# Patient Record
Sex: Female | Born: 1955 | Race: White | Hispanic: No | Marital: Married | Smoking: Never smoker
Health system: Southern US, Community
[De-identification: ages and names within clinical notes are randomized; demographics above are authoritative.]

## PROBLEM LIST (undated history)

## (undated) DIAGNOSIS — E739 Lactose intolerance, unspecified: Secondary | ICD-10-CM

## (undated) DIAGNOSIS — K6389 Other specified diseases of intestine: Secondary | ICD-10-CM

## (undated) DIAGNOSIS — N3010 Interstitial cystitis (chronic) without hematuria: Secondary | ICD-10-CM

## (undated) DIAGNOSIS — K219 Gastro-esophageal reflux disease without esophagitis: Secondary | ICD-10-CM

## (undated) DIAGNOSIS — E0500 Thyrotoxicosis with diffuse goiter without thyrotoxic crisis or storm: Secondary | ICD-10-CM

## (undated) DIAGNOSIS — Z8719 Personal history of other diseases of the digestive system: Secondary | ICD-10-CM

## (undated) DIAGNOSIS — K581 Irritable bowel syndrome with constipation: Secondary | ICD-10-CM

## (undated) DIAGNOSIS — J309 Allergic rhinitis, unspecified: Secondary | ICD-10-CM

## (undated) DIAGNOSIS — M1990 Unspecified osteoarthritis, unspecified site: Secondary | ICD-10-CM

## (undated) DIAGNOSIS — F419 Anxiety disorder, unspecified: Secondary | ICD-10-CM

## (undated) DIAGNOSIS — C719 Malignant neoplasm of brain, unspecified: Secondary | ICD-10-CM

## (undated) DIAGNOSIS — G4700 Insomnia, unspecified: Secondary | ICD-10-CM

## (undated) DIAGNOSIS — K638219 Small intestinal bacterial overgrowth, unspecified: Secondary | ICD-10-CM

## (undated) DIAGNOSIS — E039 Hypothyroidism, unspecified: Secondary | ICD-10-CM

## (undated) HISTORY — DX: Insomnia, unspecified: G47.00

## (undated) HISTORY — DX: Anxiety disorder, unspecified: F41.9

## (undated) HISTORY — DX: Other specified diseases of intestine: K63.89

## (undated) HISTORY — PX: BRAIN SURGERY: SHX531

## (undated) HISTORY — DX: Unspecified osteoarthritis, unspecified site: M19.90

## (undated) HISTORY — DX: Irritable bowel syndrome with constipation: K58.1

## (undated) HISTORY — DX: Small intestinal bacterial overgrowth, unspecified: K63.8219

---

## 1999-07-13 ENCOUNTER — Other Ambulatory Visit: Admission: RE | Admit: 1999-07-13 | Discharge: 1999-07-13 | Payer: Self-pay | Admitting: Obstetrics and Gynecology

## 1999-12-31 ENCOUNTER — Other Ambulatory Visit: Admission: RE | Admit: 1999-12-31 | Discharge: 1999-12-31 | Payer: Self-pay | Admitting: Obstetrics and Gynecology

## 2000-08-09 ENCOUNTER — Other Ambulatory Visit: Admission: RE | Admit: 2000-08-09 | Discharge: 2000-08-09 | Payer: Self-pay | Admitting: Obstetrics and Gynecology

## 2000-09-08 ENCOUNTER — Ambulatory Visit (HOSPITAL_COMMUNITY): Admission: RE | Admit: 2000-09-08 | Discharge: 2000-09-08 | Payer: Self-pay | Admitting: Gastroenterology

## 2001-01-06 ENCOUNTER — Other Ambulatory Visit: Admission: RE | Admit: 2001-01-06 | Discharge: 2001-01-06 | Payer: Self-pay | Admitting: Obstetrics and Gynecology

## 2002-01-25 ENCOUNTER — Other Ambulatory Visit: Admission: RE | Admit: 2002-01-25 | Discharge: 2002-01-25 | Payer: Self-pay | Admitting: Obstetrics and Gynecology

## 2003-01-28 ENCOUNTER — Other Ambulatory Visit: Admission: RE | Admit: 2003-01-28 | Discharge: 2003-01-28 | Payer: Self-pay | Admitting: Obstetrics and Gynecology

## 2003-03-18 ENCOUNTER — Encounter: Payer: Self-pay | Admitting: Gastroenterology

## 2003-03-18 ENCOUNTER — Encounter: Admission: RE | Admit: 2003-03-18 | Discharge: 2003-03-18 | Payer: Self-pay | Admitting: Gastroenterology

## 2004-02-21 ENCOUNTER — Other Ambulatory Visit: Admission: RE | Admit: 2004-02-21 | Discharge: 2004-02-21 | Payer: Self-pay | Admitting: Obstetrics and Gynecology

## 2004-11-14 ENCOUNTER — Inpatient Hospital Stay (HOSPITAL_COMMUNITY): Admission: AD | Admit: 2004-11-14 | Discharge: 2004-11-16 | Payer: Self-pay | Admitting: Neurosurgery

## 2004-11-14 ENCOUNTER — Emergency Department (HOSPITAL_COMMUNITY): Admission: EM | Admit: 2004-11-14 | Discharge: 2004-11-14 | Payer: Self-pay | Admitting: Emergency Medicine

## 2004-11-17 ENCOUNTER — Inpatient Hospital Stay (HOSPITAL_COMMUNITY): Admission: AD | Admit: 2004-11-17 | Discharge: 2004-11-21 | Payer: Self-pay | Admitting: Neurosurgery

## 2004-11-18 ENCOUNTER — Encounter: Payer: Self-pay | Admitting: Neurosurgery

## 2004-11-18 ENCOUNTER — Encounter (INDEPENDENT_AMBULATORY_CARE_PROVIDER_SITE_OTHER): Payer: Self-pay | Admitting: Specialist

## 2004-12-02 ENCOUNTER — Encounter: Admission: RE | Admit: 2004-12-02 | Discharge: 2004-12-02 | Payer: Self-pay | Admitting: Neurosurgery

## 2004-12-02 ENCOUNTER — Ambulatory Visit (HOSPITAL_COMMUNITY): Admission: RE | Admit: 2004-12-02 | Discharge: 2004-12-02 | Payer: Self-pay | Admitting: *Deleted

## 2004-12-15 ENCOUNTER — Encounter: Admission: RE | Admit: 2004-12-15 | Discharge: 2004-12-15 | Payer: Self-pay | Admitting: Neurosurgery

## 2004-12-30 ENCOUNTER — Ambulatory Visit: Payer: Self-pay | Admitting: Oncology

## 2005-01-20 ENCOUNTER — Ambulatory Visit: Admission: RE | Admit: 2005-01-20 | Discharge: 2005-02-26 | Payer: Self-pay | Admitting: *Deleted

## 2005-01-26 ENCOUNTER — Encounter: Admission: RE | Admit: 2005-01-26 | Discharge: 2005-01-26 | Payer: Self-pay | Admitting: Neurosurgery

## 2005-04-12 ENCOUNTER — Other Ambulatory Visit: Admission: RE | Admit: 2005-04-12 | Discharge: 2005-04-12 | Payer: Self-pay | Admitting: Obstetrics and Gynecology

## 2006-01-09 IMAGING — CT CT HEAD W/O CM
1 series · 16 of 30 positions shown, 20 images · non-contrast
Comparison: none

CLINICAL DATA: 48 year-old follow-up craniotomy.
NONCONTRAST CRANIAL CT:
Standard head CT was performed without contrast and compared to previous study of 12/02/04.  
Intracranially, stable surgical changes in the right temporal area with encephalomalacia.  Resolving pneumocephalus.  No extraaxial fluid collections are seen. Ventricles are normal in size and in the midline.  The blood products have resolved and there is more of a cystic appearing area in the upper aspect of this lesion. No new lesions are seen. No complicating features such as infarction. The brainstem and cerebellum appear normal.

[Series 2: brain · axial · 0.49mm/px · z∈[+40,+179]mm · 16 of 30 slices shown, 20 images]
[im 2/30  brain]
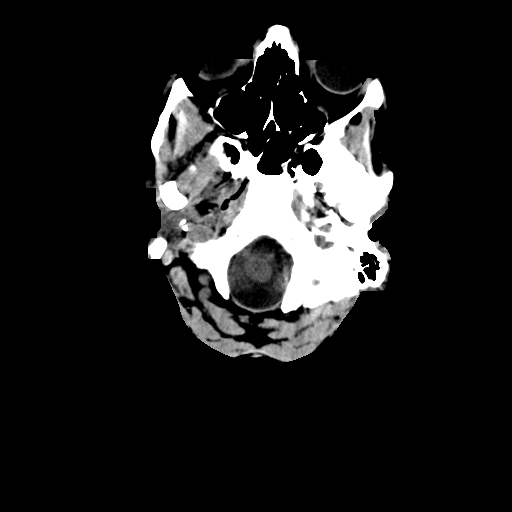
[im 2/30  bone]
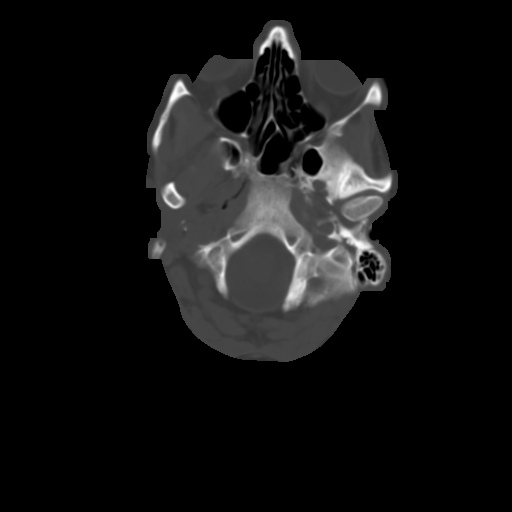
[im 4/30  brain]
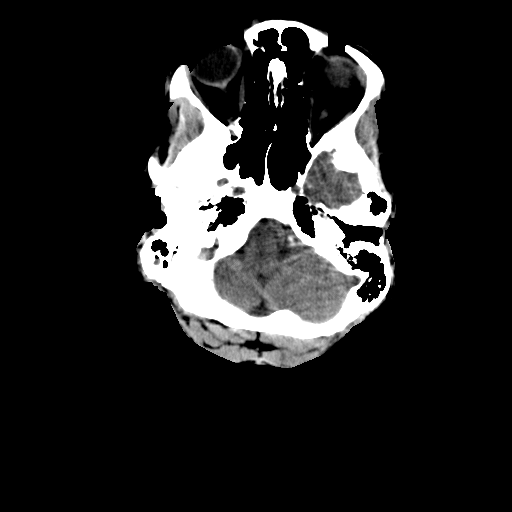
[im 6/30  brain]
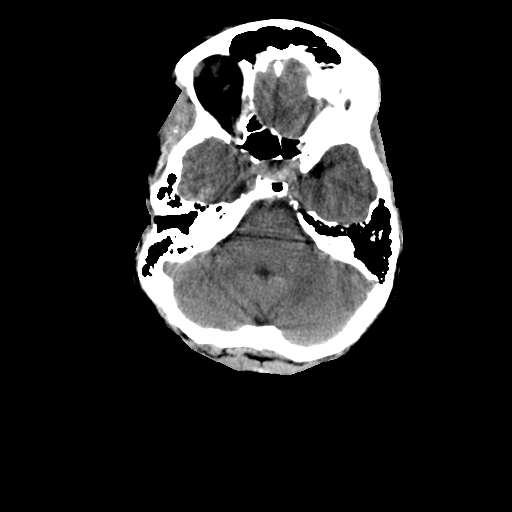
[im 8/30  brain]
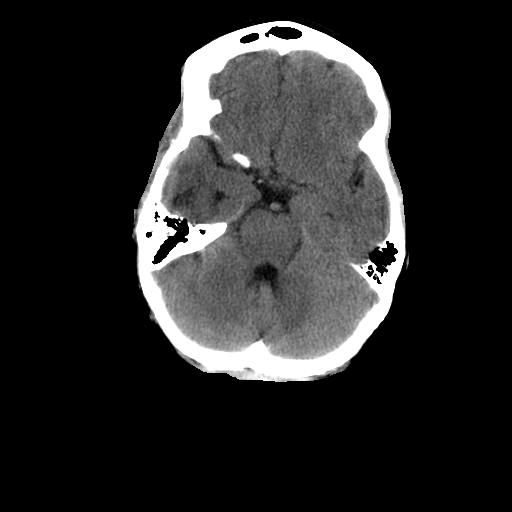
[im 9/30  brain]
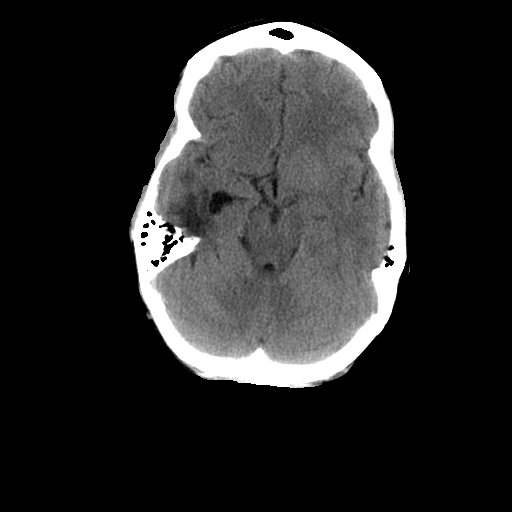
[im 9/30  bone]
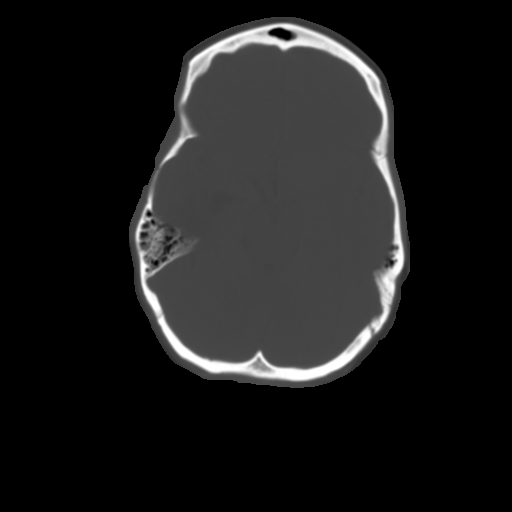
[im 11/30  brain]
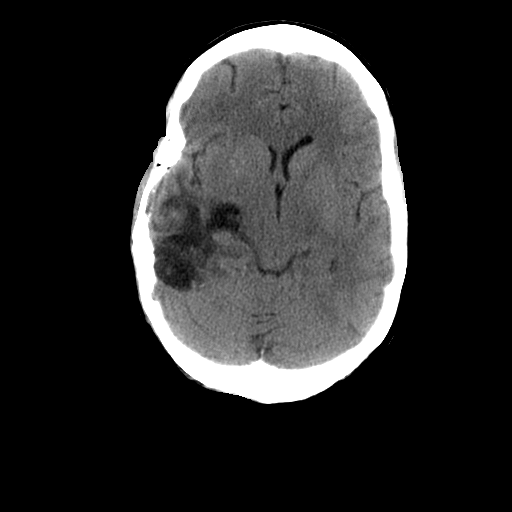
[im 13/30  brain]
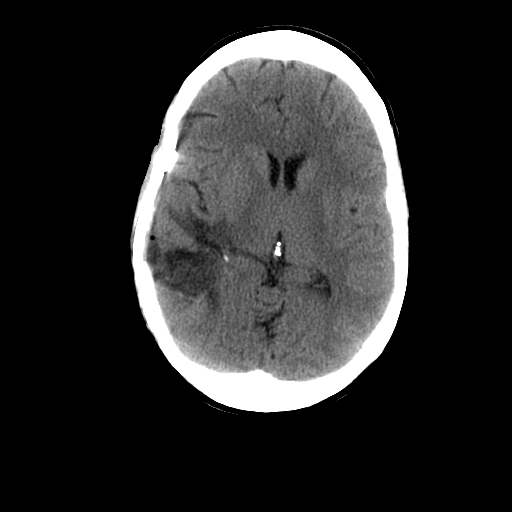
[im 15/30  brain]
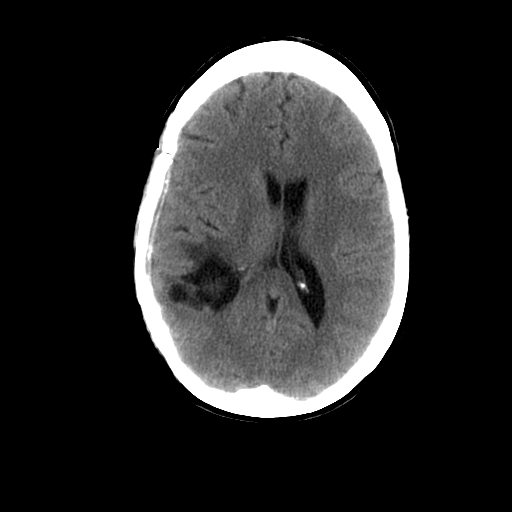
[im 16/30  brain]
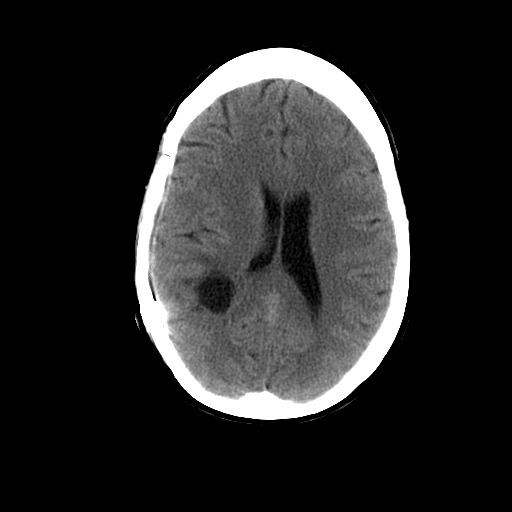
[im 16/30  bone]
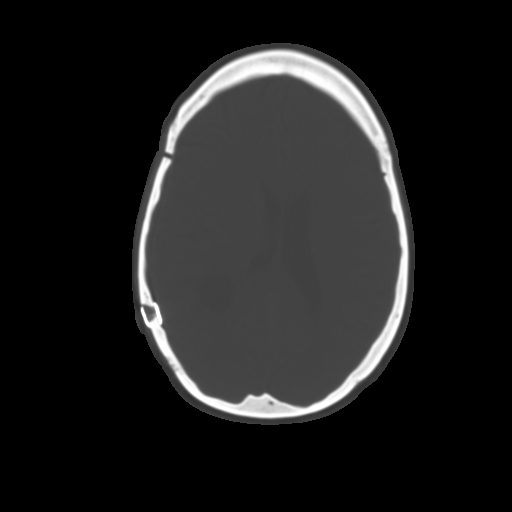
[im 18/30  brain]
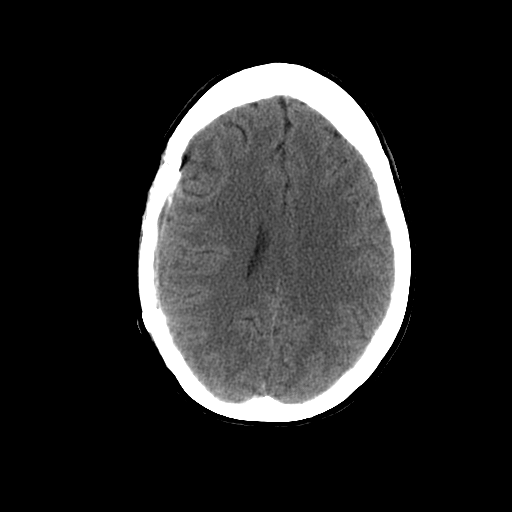
[im 20/30  brain]
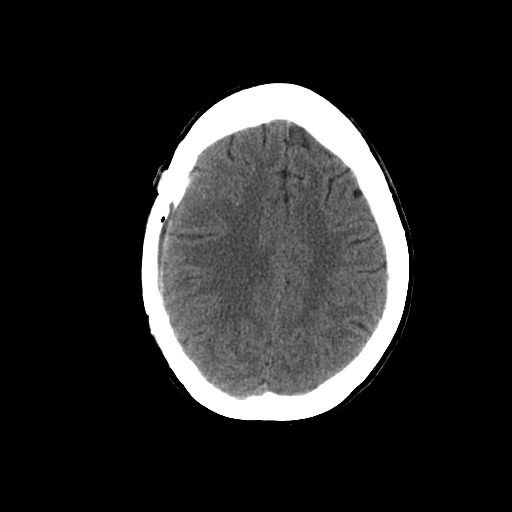
[im 22/30  brain]
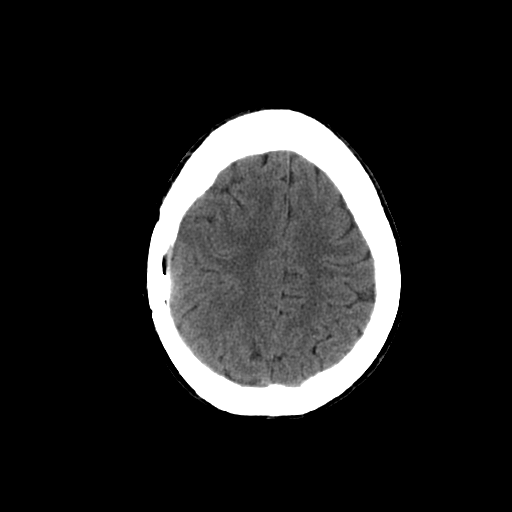
[im 23/30  brain]
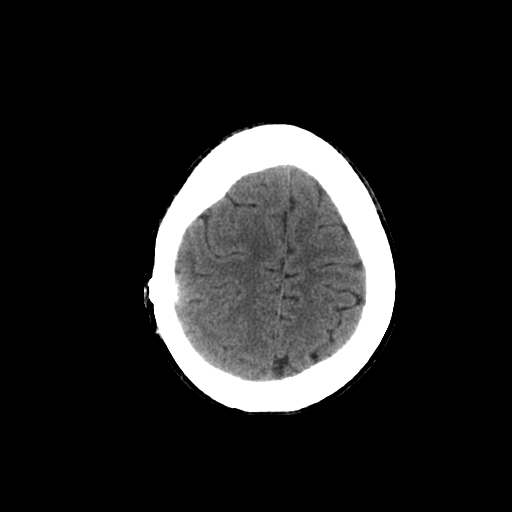
[im 23/30  bone]
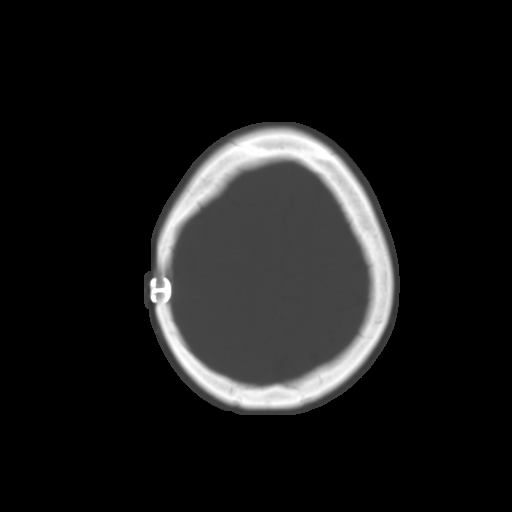
[im 25/30  brain]
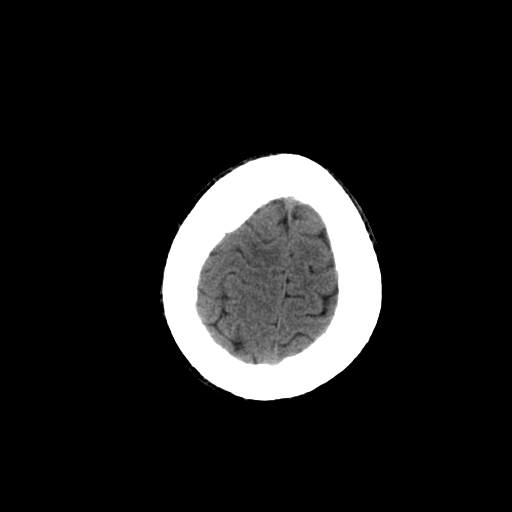
[im 27/30  brain]
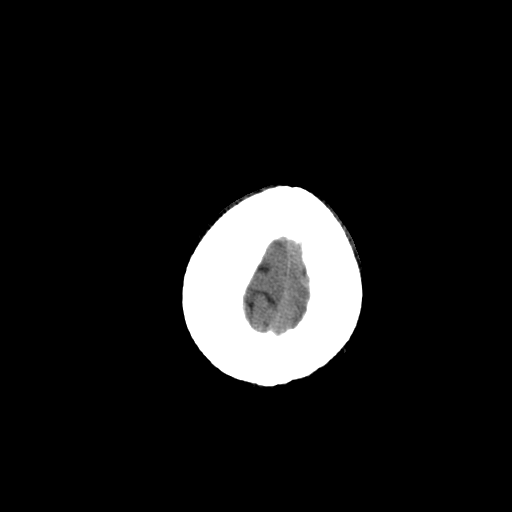
[im 29/30  brain]
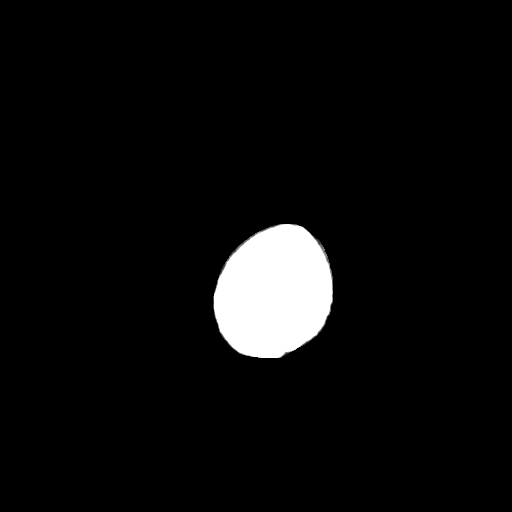

[16 of 30 positions shown; findings below may reference images not displayed]

IMPRESSION: 1.  Stable operative changes of a right temporoparietal craniotomy. 
2.  Large area of encephalomalacia with resolving blood products and pneumocephalus.
3.  Mild dilatation of the temporal horn of the right lateral ventricle and more focal cystic area near the atria of the right lateral ventricle.
4.  No new brain lesions.

## 2007-05-02 ENCOUNTER — Encounter: Admission: RE | Admit: 2007-05-02 | Discharge: 2007-05-02 | Payer: Self-pay | Admitting: Gastroenterology

## 2008-11-15 ENCOUNTER — Ambulatory Visit: Payer: Self-pay | Admitting: Occupational Medicine

## 2010-03-20 ENCOUNTER — Encounter: Admission: RE | Admit: 2010-03-20 | Discharge: 2010-03-20 | Payer: Self-pay | Admitting: Gastroenterology

## 2010-09-13 ENCOUNTER — Encounter: Payer: Self-pay | Admitting: Neurosurgery

## 2011-01-08 NOTE — Procedures (Signed)
[facility]  Patient:    Margaret Miles, Margaret Miles                    MRN: [id number] Proc. Date: 09/08/00 Attending:  Fayrene Fearing L. Margaret Miles, M.D. CC:         Margaret Miles. Margaret Miles, M.D.   Procedure Report  DATE OF BIRTH:  1955/09/13  PROCEDURE:  Esophagogastroduodenoscopy.  GASTROENTEROLOGIST:  Margaret Miles. Margaret Miles, M.D.  MEDICATIONS:  Cetacaine spray, fentanyl 75 mcg, Versed 6.5 mg IV.  INDICATIONS: Persistent reflux symptoms.  DESCRIPTION OF PROCEDURE:  The procedure had been explained to the patient and consent obtained.  With the patient in the left lateral decubitus position, the Olympus video endoscope was inserted blindly into the esophagus and advanced under direct visualization.  The stomach was entered, pylorus identified and passed.  The duodenum including the bulb and second portion was seen well and unremarkable.  The scope was withdrawn back into the stomach, and the antrum and body were examined as was the pyloric channel.  All were normal.  Fundus and cardia were seen well on the retroflexed view and were normal.  The scope was withdrawn.  THe distal esophagus was normal.  There was a hiatal hernia and widely patent GE junction.  No evidence whatsoever of Barretts esophagus.  The scope was withdrawn.  The proximal esophagus was normal.  The patient tolerated it well.  ASSESSMENT: 1. Hiatal hernia with patent gastroesophageal junction. 2. No evidence of Barretts esophagus.  PLAN: 1. We will keep on same medication. 2. Reflux instructions. 3. Will see in the office in six months. DD:  09/08/00 TD:  09/09/00 Job: [id number]

## 2011-01-08 NOTE — Consult Note (Signed)
Margaret Miles, Margaret Miles           ACCOUNT NO.:  [id number]   MEDICAL RECORD NO.:  [id number]          PATIENT TYPE:  EMS   LOCATION:  ED                           FACILITY:  [facility]   PHYSICIAN:  Sigmund I. Patsi Sears, M.D.DATE OF BIRTH:  1955-12-22   DATE OF CONSULTATION:  11/14/2004  DATE OF DISCHARGE:                                   CONSULTATION   SUBJECTIVE:  A 55 year old white married female, seen in [facility] today with history of urodynamics on Wednesday.  The patient  was given antibiotic on Wednesday but developed nausea, vomiting, and severe  headache.  The patient skipped her Thursday antibiotic dose.  She complains  of dizziness and vertigo at this time, with nausea, vomiting.   PAST MEDICAL HISTORY:  1.  Interstitial cystitis.  2.  Irritable bowel syndrome.  3.  (?) migraines.  4.  Anxiety, chronic.  5.  Negative history for asthma, negative history for fibromyalgia.   MEDICATIONS:  1.  Klonopin and Lunesta for anxiety and sleep.  2.  Nexium 40 for GERD.  3.  Flonase.  4.  Sudafed and Afrin.   PHYSICAL EXAMINATION:  GENERAL:  She is a thin, white female in no acute  distress (post-Toradol).  VITAL SIGNS:  Blood pressure 125/80, pulse 79, respiratory rate 20, O2  saturation 100% on room air.  HEENT:  PERRLA.  EOM full.  Eyes are normal.  Ears are normal.  NECK:  Supple, nontender, no nodes.  There is no erythema, no tenderness.  The neck is negative.  ABDOMEN:  Soft, plus bowel sounds, without organomegaly, without masses.  GENITOURINARY:  She has normal female BUS.  RECTAL EXAMINATION:  Not indicated this examination.  EXTREMITIES:  No cyanosis or edema.  SKIN:  Normal to inspection and palpation.  PSYCHOLOGIC:  She has normal orientation to time, person, and place.   White blood cell count 4700, hemoglobin 10.9, hematocrit 33.76, and platelet  count 199,000.  Sodium 139, potassium 3.9, chloride 107, CO2 25, BUN 7,  creatinine 0.8.   calcium 8.7, magnesium 1.8.  Urinalysis shows specific  gravity 1.027, pH 6.0, dipstick negative.   IMPRESSION:  The patient with multiple medical issues and with history of  interstitial cystitis.  Her nausea and vomiting and low back pain have  improved since she received her Toradol.  Her dehydration appears to be  improving with IV fluid, and her headache is improved as well.  The patient  was given Rocephin 1 g IV and will not need oral antibiotics (no multiple  intolerances).   PLAN:  We will ask the patient to follow up with Dr. Earl Gala for her ENT  problems.  She will be seeing Dr. Logan Bores __________ for her interstitial  cystitis problems.      SIT/MEDQ  D:  11/14/2004  T:  11/14/2004  Job:  [id number]   cc:   Theressa Millard, M.D.  [address] [place]  [place] [address]  Fax: [phone]   Jamison Neighbor, M.D.  [address]  [place]  [place] [address]  Fax: [phone]

## 2011-01-08 NOTE — Op Note (Signed)
NAMEHADLEIGH, FELBER           ACCOUNT NO.:  [id number]   MEDICAL RECORD NO.:  [id number]          PATIENT TYPE:  INP   LOCATION:  3110                         FACILITY:  [facility]   PHYSICIAN:  Sherilyn Cooter A. Pool, M.D.    DATE OF BIRTH:  [birth year]   DATE OF PROCEDURE:  11/18/2004  DATE OF DISCHARGE:                                 OPERATIVE REPORT   SERVICE:  Neurosurgery.   PREOPERATIVE DIAGNOSES:  Right temporal parietal neoplasm.   POSTOPERATIVE DIAGNOSIS:  Right temporal parietal neoplasm.   PROCEDURE:  Right temporal parietal craniotomy with resection of tumor,  interoperative stereotactic tumor resection.   SURGEON:  Kathaleen Maser. Pool, M.D.   ASSISTANT:  Donalee Citrin, M.D.   ANESTHESIA:  General endotracheal.   INDICATIONS FOR PROCEDURE:  Margaret Miles is 55 year old female with a  history of progressive headaches and some mild left sided facial weakness  who was found to have a large right temporal parietal neoplasm extending  into the lateral wall of the trigone of the lateral ventricle.  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  We discussed the  options for management.  The patient's tumor is approximately 7 cm in  diameter with marked mass affect.  She presents now for tumor resection.  She is aware of the risks and benefits including but not limited to the  risks of anesthesia, bleeding, infection, stroke, coma, tumor recurrence,  need for further therapy, and even death.  The patient is aware of these  risks and wishes to proceed.   DESCRIPTION OF PROCEDURE:  The patient was taken to the operating room where  she was placed on the operating table in a supine position.  After an  adequate level of anesthesia is achieved, the patient is positioned with her  head turned towards the left and fixed in a [place] pin headrest.  The  patient's skull was then entered into the  stereotactic computer.  The  Stealth stereotactic system was used to choose the optimal entry point.  A  scalp flap was made above the right ear and reflected inferiorly after the  scalp was prepped and draped sterilely.  A right temporal parietal  craniotomy was then performed using the high speed drill.  The bone flap was  elevated.  The dura was opened and reflected along the floor of the middle  fossa and petrous ridge.  The posterior temporal lobe was obviously quite  thickened and edematous.  A cortical incision was made in the posterior  aspect of the middle temporal gyrus.  This was carried down into the white  matter where the tumor was encountered.  The tumor was then gradually  debulked working around the periphery, developing a regular plane between  the surrounding white matter and tumor.  The plane, itself, was not easily  definable.  The tumor was gradually debulked in a piecemeal fashion.  A  gross total tumor resection was achieved by working around the tumor  margins.  The inferior aspect of the posterior temporal lobe was completely  resected.  The tumor was resected all the way to the lateral wall of the  ventricular trigone on the right side.  There was no gross evidence of  remaining tumor.  All hemostasis was achieved with bipolar electrocautery.  Interoperative stereotactic confirmed good surgical resection.  The wound  was then irrigated with saline solution.  Surgicel was placed along the  tumor bed for hemostasis which was found to be good.  The wound was allowed  to rest and hemostasis was once again checked and found to be good.  The  dura was reapproximated with 4-0 Nurolon.  Gelfoam was placed over the dural  repair, tack up sutures were placed.  The skull bone was then reapproximated  using four rapid flap clamps.  Temporalis fascia was reapproximated using 2-  0 Vicryl suture.  The galea was reapproximated using 2-0 Vicryl suture.  The  scalp was  reapproximated using staples.  There were no complications.  The  patient tolerated the procedure well and returns to the recovery room  postop.  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  Final pathology is pending.      HAP/MEDQ  D:  11/18/2004  T:  11/18/2004  Job:  [id number]

## 2011-01-08 NOTE — Discharge Summary (Signed)
Margaret Miles, Margaret Miles           ACCOUNT NO.:  [id number]   MEDICAL RECORD NO.:  [id number]          PATIENT TYPE:  INP   LOCATION:  3023                         FACILITY:  [facility]   PHYSICIAN:  Mallie Mussel A. Pool, M.D.    DATE OF BIRTH:  Jan 18, 1956   DATE OF ADMISSION:  11/17/2004  DATE OF DISCHARGE:  11/21/2004                                 DISCHARGE SUMMARY   FINAL DIAGNOSIS:  Right temporal neoplasm.   OPERATIONS/TREATMENT:  Right temporal parietal craniotomy with resection of  tumor.   HISTORY OF PRESENT ILLNESS:  [name] is a 55 year old female with  history of progressive headaches. Workup demonstrates evidence of a right  posterior temporal mass. The patient presents now for surgical resection.   HOSPITAL COURSE:  The patient was taken to the operating room where an  uncomplicated surgical resection of this right temporal mass was performed.  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 The  patient progressed well neurologically postoperative. She had minimal  headaches. Neurologically, she is awake and alert, oriented and appropriate.  Cranial nerve function was intact. Motor and sensory examination of the  extremities was normal. At the time of discharge, her wound is healing well.  She is having no difficulty with seizures.   FOLLOW UP:  She will follow up in my office in one week.   CONDITION ON DISCHARGE:  Improved.           ______________________________  [name], M.D.     HAP/MEDQ  D:  04/13/2005  T:  04/13/2005  Job:  [id number]

## 2011-04-01 ENCOUNTER — Other Ambulatory Visit: Payer: Self-pay | Admitting: Internal Medicine

## 2011-04-02 ENCOUNTER — Inpatient Hospital Stay: Admission: RE | Admit: 2011-04-02 | Payer: Self-pay | Source: Ambulatory Visit

## 2011-04-05 ENCOUNTER — Other Ambulatory Visit: Payer: Self-pay | Admitting: Orthopedic Surgery

## 2011-04-05 DIAGNOSIS — M75100 Unspecified rotator cuff tear or rupture of unspecified shoulder, not specified as traumatic: Secondary | ICD-10-CM

## 2011-04-07 ENCOUNTER — Ambulatory Visit
Admission: RE | Admit: 2011-04-07 | Discharge: 2011-04-07 | Disposition: A | Discharge status: Home / Self Care | Payer: 59 | Source: Ambulatory Visit | Attending: Internal Medicine | Admitting: Internal Medicine

## 2011-04-10 ENCOUNTER — Ambulatory Visit
Admission: RE | Admit: 2011-04-10 | Discharge: 2011-04-10 | Disposition: A | Discharge status: Home / Self Care | Payer: 59 | Source: Ambulatory Visit | Attending: Orthopedic Surgery | Admitting: Orthopedic Surgery

## 2011-04-10 DIAGNOSIS — M75100 Unspecified rotator cuff tear or rupture of unspecified shoulder, not specified as traumatic: Secondary | ICD-10-CM

## 2011-05-10 ENCOUNTER — Other Ambulatory Visit (HOSPITAL_COMMUNITY): Payer: Self-pay | Admitting: Internal Medicine

## 2011-05-10 DIAGNOSIS — E0500 Thyrotoxicosis with diffuse goiter without thyrotoxic crisis or storm: Secondary | ICD-10-CM

## 2011-05-26 ENCOUNTER — Encounter (HOSPITAL_COMMUNITY)
Admission: RE | Admit: 2011-05-26 | Discharge: 2011-05-26 | Disposition: A | Discharge status: Home / Self Care | Payer: 59 | Source: Ambulatory Visit | Attending: Internal Medicine | Admitting: Internal Medicine

## 2011-05-26 DIAGNOSIS — E0500 Thyrotoxicosis with diffuse goiter without thyrotoxic crisis or storm: Secondary | ICD-10-CM | POA: Insufficient documentation

## 2011-05-27 ENCOUNTER — Encounter (HOSPITAL_COMMUNITY)
Admission: RE | Admit: 2011-05-27 | Discharge: 2011-05-27 | Disposition: A | Discharge status: Home / Self Care | Payer: 59 | Source: Ambulatory Visit | Attending: Internal Medicine | Admitting: Internal Medicine

## 2011-05-27 DIAGNOSIS — R946 Abnormal results of thyroid function studies: Secondary | ICD-10-CM | POA: Insufficient documentation

## 2011-05-27 DIAGNOSIS — E0590 Thyrotoxicosis, unspecified without thyrotoxic crisis or storm: Secondary | ICD-10-CM | POA: Insufficient documentation

## 2011-05-27 DIAGNOSIS — E0500 Thyrotoxicosis with diffuse goiter without thyrotoxic crisis or storm: Secondary | ICD-10-CM | POA: Insufficient documentation

## 2011-05-27 MED ORDER — SODIUM IODIDE I 131 CAPSULE
13.0400 | Freq: Once | INTRAVENOUS | Status: AC | PRN
  Administered 2011-05-26: 13.04 via ORAL

## 2011-05-27 MED ORDER — SODIUM PERTECHNETATE TC 99M INJECTION
10.0000 | Freq: Once | INTRAVENOUS | Status: AC | PRN
  Administered 2011-05-27: 10 via INTRAVENOUS

## 2011-06-02 ENCOUNTER — Other Ambulatory Visit (HOSPITAL_COMMUNITY): Payer: Self-pay | Admitting: Internal Medicine

## 2011-06-02 ENCOUNTER — Ambulatory Visit (HOSPITAL_COMMUNITY)
Admission: RE | Admit: 2011-06-02 | Discharge: 2011-06-02 | Disposition: A | Discharge status: Home / Self Care | Payer: 59 | Source: Ambulatory Visit | Attending: Internal Medicine | Admitting: Internal Medicine

## 2011-06-02 DIAGNOSIS — E0590 Thyrotoxicosis, unspecified without thyrotoxic crisis or storm: Secondary | ICD-10-CM

## 2011-06-02 MED ORDER — SODIUM IODIDE I 131 CAPSULE
12.2000 | Freq: Once | INTRAVENOUS | Status: AC | PRN
  Administered 2011-06-02: 12.2 via ORAL

## 2011-08-31 ENCOUNTER — Other Ambulatory Visit: Payer: Self-pay | Admitting: Dermatology

## 2012-02-01 ENCOUNTER — Other Ambulatory Visit: Payer: Self-pay | Admitting: Internal Medicine

## 2012-02-01 DIAGNOSIS — E0500 Thyrotoxicosis with diffuse goiter without thyrotoxic crisis or storm: Secondary | ICD-10-CM

## 2012-02-04 ENCOUNTER — Ambulatory Visit
Admission: RE | Admit: 2012-02-04 | Discharge: 2012-02-04 | Disposition: A | Discharge status: Home / Self Care | Payer: 59 | Source: Ambulatory Visit | Attending: Internal Medicine | Admitting: Internal Medicine

## 2012-02-04 DIAGNOSIS — E0500 Thyrotoxicosis with diffuse goiter without thyrotoxic crisis or storm: Secondary | ICD-10-CM

## 2012-06-23 ENCOUNTER — Encounter (HOSPITAL_COMMUNITY): Payer: Self-pay

## 2012-06-23 ENCOUNTER — Ambulatory Visit (HOSPITAL_COMMUNITY): Payer: 59 | Admitting: *Deleted

## 2012-06-23 ENCOUNTER — Encounter (HOSPITAL_COMMUNITY): Payer: Self-pay | Admitting: *Deleted

## 2012-06-23 ENCOUNTER — Ambulatory Visit (HOSPITAL_COMMUNITY)
Admission: RE | Admit: 2012-06-23 | Discharge: 2012-06-23 | Disposition: A | Discharge status: Home / Self Care | Payer: 59 | Source: Ambulatory Visit | Attending: Gastroenterology | Admitting: Gastroenterology

## 2012-06-23 ENCOUNTER — Encounter (HOSPITAL_COMMUNITY)
Admission: RE | Disposition: A | Discharge status: Home / Self Care | Payer: Self-pay | Source: Ambulatory Visit | Attending: Gastroenterology

## 2012-06-23 DIAGNOSIS — K219 Gastro-esophageal reflux disease without esophagitis: Secondary | ICD-10-CM | POA: Insufficient documentation

## 2012-06-23 DIAGNOSIS — Z1211 Encounter for screening for malignant neoplasm of colon: Secondary | ICD-10-CM | POA: Insufficient documentation

## 2012-06-23 HISTORY — DX: Interstitial cystitis (chronic) without hematuria: N30.10

## 2012-06-23 HISTORY — DX: Thyrotoxicosis with diffuse goiter without thyrotoxic crisis or storm: E05.00

## 2012-06-23 HISTORY — DX: Hypothyroidism, unspecified: E03.9

## 2012-06-23 HISTORY — DX: Lactose intolerance, unspecified: E73.9

## 2012-06-23 HISTORY — DX: Allergic rhinitis, unspecified: J30.9

## 2012-06-23 HISTORY — DX: Gastro-esophageal reflux disease without esophagitis: K21.9

## 2012-06-23 HISTORY — PX: COLONOSCOPY WITH PROPOFOL: SHX5780

## 2012-06-23 HISTORY — DX: Malignant neoplasm of brain, unspecified: C71.9

## 2012-06-23 HISTORY — DX: Personal history of other diseases of the digestive system: Z87.19

## 2012-06-23 SURGERY — COLONOSCOPY WITH PROPOFOL
Anesthesia: Monitor Anesthesia Care

## 2012-06-23 MED ORDER — FENTANYL CITRATE 0.05 MG/ML IJ SOLN
INTRAMUSCULAR | Status: DC | PRN
  Administered 2012-06-23 (×2): 50 ug via INTRAVENOUS

## 2012-06-23 MED ORDER — LACTATED RINGERS IV SOLN
INTRAVENOUS | Status: DC
  Administered 2012-06-23: 1000 mL via INTRAVENOUS

## 2012-06-23 MED ORDER — PROPOFOL 10 MG/ML IV EMUL
INTRAVENOUS | Status: DC | PRN
  Administered 2012-06-23: 250 ug/kg/min via INTRAVENOUS

## 2012-06-23 MED ORDER — LACTATED RINGERS IV SOLN
INTRAVENOUS | Status: DC | PRN
  Administered 2012-06-23: 10:00:00 via INTRAVENOUS

## 2012-06-23 MED ORDER — MIDAZOLAM HCL 5 MG/5ML IJ SOLN
INTRAMUSCULAR | Status: DC | PRN
  Administered 2012-06-23 (×2): 1 mg via INTRAVENOUS

## 2012-06-23 SURGICAL SUPPLY — 21 items

## 2012-06-23 NOTE — Op Note (Signed)
Procedure: Screening colonoscopy.  Endoscopist: Danise Edge.  Premedication: Propofol administered by anesthesia.  Procedure: The patient was placed in the left lateral decubitus position. Anal inspection and digital rectal exam were normal. The Pentax pediatric colonoscope was introduced into the rectum and advanced to the cecum. A normal-appearing ileocecal valve and appendiceal orifice were identified. Colonic preparation for the exam today was good. Advancement of the colonoscope was technically difficult due to colonic loop formation.  Rectum. Normal. Retroflexed view of the distal rectum normal. Sigmoid colon and descending colon. Normal. Splenic flexure. Normal. Transverse colon. Normal. Hepatic flexure. Normal. Ascending colon. Normal. Cecum and ileocecal valve. Normal.  Assessment: Normal screening proctocolonoscopy to the cecum.  Recommendations: Schedule repeat screening colonoscopy in 10 years.

## 2012-06-23 NOTE — Transfer of Care (Signed)
Immediate Anesthesia Transfer of Care Note  Patient: Margaret Miles  Procedure(s) Performed: Procedure(s) (LRB) with comments: COLONOSCOPY WITH PROPOFOL (N/A)  Patient Location: PACU and Endoscopy Unit  Anesthesia Type:MAC  Level of Consciousness: awake, oriented, patient cooperative, lethargic and responds to stimulation  Airway & Oxygen Therapy: Patient Spontanous Breathing and Patient connected to face mask oxygen  Post-op Assessment: Report given to PACU RN, Post -op Vital signs reviewed and stable and Patient moving all extremities  Post vital signs: Reviewed and stable  Complications: No apparent anesthesia complications

## 2012-06-23 NOTE — Preoperative (Signed)
Beta Blockers   Reason not to administer Beta Blockers:Not Applicable 

## 2012-06-23 NOTE — Anesthesia Postprocedure Evaluation (Signed)
  Anesthesia Post-op Note  Patient: Margaret Miles  Procedure(s) Performed: Procedure(s) (LRB): COLONOSCOPY WITH PROPOFOL (N/A)  Patient Location: PACU  Anesthesia Type: MAC  Level of Consciousness: awake and alert   Airway and Oxygen Therapy: Patient Spontanous Breathing  Post-op Pain: mild  Post-op Assessment: Post-op Vital signs reviewed, Patient's Cardiovascular Status Stable, Respiratory Function Stable, Patent Airway and No signs of Nausea or vomiting  Post-op Vital Signs: stable  Complications: No apparent anesthesia complications

## 2012-06-23 NOTE — Anesthesia Preprocedure Evaluation (Addendum)
Anesthesia Evaluation  Patient identified by MRN, date of birth, ID band Patient awake    Reviewed: Allergy & Precautions, H&P , NPO status , Patient's Chart, lab work & pertinent test results  Airway Mallampati: II TM Distance: >3 FB Neck ROM: Full    Dental No notable dental hx.    Pulmonary neg pulmonary ROS,  breath sounds clear to auscultation  Pulmonary exam normal       Cardiovascular negative cardio ROS  Rhythm:Regular Rate:Normal     Neuro/Psych H/O brain surgery, s/p craniotomy in 2006. No deficits. negative psych ROS   GI/Hepatic Neg liver ROS, hiatal hernia, GERD-  Medicated,  Endo/Other  Hypothyroidism Hyperthyroidism H/O Grave's s/p ablation, now euthyroid on synthroid.  Renal/GU negative Renal ROS  negative genitourinary   Musculoskeletal negative musculoskeletal ROS (+)   Abdominal   Peds negative pediatric ROS (+)  Hematology negative hematology ROS (+)   Anesthesia Other Findings   Reproductive/Obstetrics negative OB ROS                          Anesthesia Physical Anesthesia Plan  ASA: II  Anesthesia Plan: MAC   Post-op Pain Management:    Induction: Intravenous  Airway Management Planned: Simple Face Mask  Additional Equipment:   Intra-op Plan:   Post-operative Plan:   Informed Consent: I have reviewed the patients History and Physical, chart, labs and discussed the procedure including the risks, benefits and alternatives for the proposed anesthesia with the patient or authorized representative who has indicated his/her understanding and acceptance.   Dental advisory given  Plan Discussed with: CRNA  Anesthesia Plan Comments:         Anesthesia Quick Evaluation

## 2012-06-23 NOTE — H&P (Signed)
  Problem: Screening colonoscopy  History: The patient is a 56 year old female born 03/28/1956. The patient is scheduled to undergo a screening colonoscopy. Both of her parents have had colon polyps removed. There is no family history of colon cancer.  On 05/11/2007, the patient underwent a normal screening colonoscopy to the hepatic flexure followed by a normal single contrast barium enema.  I discussed with the patient the complications associated with screening colonoscopy with polyp removal including intestinal bleeding, intestinal perforation, and oversedation.  Allergies: Voltaren causes rash. Sulfa drugs cause a rash.  Chronic medication: Klonopin. Gabapentin. Fluticasone. Ranitidine. Synthroid.  Past medical and surgical history: Constipation predominant irritable bowel syndrome. Insomnia. Gastroesophageal reflux. Right brain glioblastoma multiforme. Allergic rhinitis. Interstitial cystitis syndrome. Lactose intolerance. Benign positional vertigo. Graves' disease. 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 Craniotomy in 2006.  Family history: Both parents had colon polyps removed.  Plan: Proceed with screening colonoscopy using propofol administered by anesthesia as scheduled.

## 2012-06-26 ENCOUNTER — Encounter (HOSPITAL_COMMUNITY): Payer: Self-pay | Admitting: Gastroenterology

## 2013-02-19 ENCOUNTER — Other Ambulatory Visit: Payer: Self-pay | Admitting: Internal Medicine

## 2013-02-19 DIAGNOSIS — R1031 Right lower quadrant pain: Secondary | ICD-10-CM

## 2013-02-20 ENCOUNTER — Ambulatory Visit
Admission: RE | Admit: 2013-02-20 | Discharge: 2013-02-20 | Disposition: A | Discharge status: Home / Self Care | Payer: 59 | Source: Ambulatory Visit | Attending: Internal Medicine | Admitting: Internal Medicine

## 2013-02-20 DIAGNOSIS — R1031 Right lower quadrant pain: Secondary | ICD-10-CM

## 2013-02-20 MED ORDER — IOHEXOL 300 MG/ML  SOLN
100.0000 mL | Freq: Once | INTRAMUSCULAR | Status: AC | PRN
  Administered 2013-02-20: 100 mL via INTRAVENOUS

## 2014-07-15 ENCOUNTER — Other Ambulatory Visit: Payer: Self-pay | Admitting: Obstetrics and Gynecology

## 2014-07-17 LAB — CYTOLOGY - PAP

## 2017-01-19 ENCOUNTER — Encounter: Payer: Self-pay | Admitting: Internal Medicine

## 2017-03-10 ENCOUNTER — Encounter: Payer: Self-pay | Admitting: *Deleted

## 2017-03-18 ENCOUNTER — Ambulatory Visit (INDEPENDENT_AMBULATORY_CARE_PROVIDER_SITE_OTHER): Payer: 59 | Admitting: Internal Medicine

## 2017-03-18 ENCOUNTER — Encounter: Payer: Self-pay | Admitting: Internal Medicine

## 2017-03-18 VITALS — BP 136/80 | HR 77 | Ht 67.0 in | Wt 146.0 lb

## 2017-03-18 DIAGNOSIS — K589 Irritable bowel syndrome without diarrhea: Secondary | ICD-10-CM | POA: Diagnosis not present

## 2017-03-18 DIAGNOSIS — R1031 Right lower quadrant pain: Secondary | ICD-10-CM

## 2017-03-18 DIAGNOSIS — K219 Gastro-esophageal reflux disease without esophagitis: Secondary | ICD-10-CM | POA: Diagnosis not present

## 2017-03-18 MED ORDER — HYOSCYAMINE SULFATE SL 0.125 MG SL SUBL: SUBLINGUAL_TABLET | SUBLINGUAL | 3 refills | Status: DC

## 2017-03-18 NOTE — Patient Instructions (Signed)
We have sent the following medications to your pharmacy for you to pick up at your convenience: [facility] will be due for a recall colonoscopy in 06/2022. We will send you a reminder in the mail when it gets closer to that time.  Please follow up with Dr Hilarie Fredrickson as needed.  If you are age 59 or older, your body mass index should be between 23-30. Your Body mass index is 22.87 kg/m. If this is out of the aforementioned range listed, please consider follow up with your Primary Care Provider.  If you are age 49 or younger, your body mass index should be between 19-25. Your Body mass index is 22.87 kg/m. If this is out of the aformentioned range listed, please consider follow up with your Primary Care Provider.

## 2017-03-18 NOTE — Progress Notes (Signed)
Patient ID: Margaret Miles, female   DOB: 10/27/1955, 61 y.o.   MRN: [id number] HPI: [name] is a 61 year old female with a past medical history of IBS, chronic recurrent right lower quadrant abdominal pain, GERD with hiatal hernia who is seen in consult at the request of Dr. Earle Gell to establish care for her GI issues upon his retirement. She is here alone today. She also has a history of glioblastoma multiform a status post resection and chemotherapy dating back to 2006 followed by Dr. Maylon Peppers at [facility], Berenice Primas' disease and now hypothyroidism, interstitial cystitis and insomnia.  She reports in general she is feeling quite well. She does continue to have intermittent right lower quadrant/right pelvic abdominal discomfort. This comes and goes. It is more annoying than truly painful. This is been evaluated with prior CT scan which was unremarkable. Pelvic ultrasound which was unremarkable. The pain does seem to be more present and slightly worse after intercourse. She describes this as a feeling of a "ovarian cranial in a different spot". Doesn't seem to relate to eating or bowel movement.  She has a history of GERD with hiatal hernia. She also reports a history of esophageal spasm. She is taking ranitidine 75 mg twice daily and she also uses a product called prelief before acidic meals. She takes daily probiotic with Align. She notes that certain foods like strawberries cause esophageal spasm so she avoids them.  Intermittently she will have crampy abdominal pain for which she has used Levsin with great success in the past. She requests refill.  She denies blood in her stool or melena. Denies dysphagia.  Her last colonoscopy was in November 2013 which was normal to the cecum. In 2008 she had an incomplete colonoscopy due to inadequate sedation and a follow-up unremarkable barium enema.  Past Medical History:  Diagnosis Date  . Allergic rhinitis   . GERD (gastroesophageal reflux  disease)   . Glioblastoma multiforme of brain ([facility])   . Graves disease   . H/O hiatal hernia   . Hypothyroidism   . Insomnia   . Interstitial cystitis   . Irritable bowel syndrome with constipation   . Lactose intolerance     Past Surgical History:  Procedure Laterality Date  . BRAIN SURGERY    . COLONOSCOPY WITH PROPOFOL  06/23/2012   Procedure: COLONOSCOPY WITH PROPOFOL;  Surgeon: Garlan Fair, MD;  Location: [facility];  Service: Endoscopy;  Laterality: N/A;    Outpatient Medications Prior to Visit  Medication Sig Dispense Refill  . Alpha-D-Galactosidase (BEANO PO) Take by mouth as needed.    . Calcium Glycerophosphate (PRELIEF) 340 (65-50) MG (CA-P) TABS Take 2 tablets by mouth. Before coffee    . clonazePAM (KLONOPIN) 1 MG tablet Take 1 mg by mouth daily.    Marland Kitchen gabapentin (NEURONTIN) 100 MG capsule Take 200 mg by mouth at bedtime.    . Lactase (LACTAID PO) Take by mouth daily.    Marland Kitchen levothyroxine (SYNTHROID, LEVOTHROID) 112 MCG tablet Take 100 mcg by mouth daily.     . Probiotic Product (ALIGN PO) Take by mouth daily.    . ranitidine (ZANTAC) 75 MG tablet Take 75 mg by mouth 2 (two) times daily.    . calcium citrate-vitamin D (CITRACAL+D) 315-200 MG-UNIT per tablet Take 2 tablets by mouth daily.     No facility-administered medications prior to visit.     Allergies  Allergen Reactions  . Erythromycin Nausea Only  . Tessalon [Benzonatate] Other (See Comments)  vertigo  . Codeine Anxiety  . Sulfa Antibiotics Rash  . Voltaren [Diclofenac Sodium] Rash    Family History  Problem Relation Age of Onset  . ALS Mother   . Diabetes Father   . Hypertension Father   . Hypothyroidism Sister   . Breast cancer Maternal Grandmother     Social History  Substance Use Topics  . Smoking status: Never Smoker  . Smokeless tobacco: Never Used  . Alcohol use Yes    ROS: As per history of present illness, otherwise negative  BP 136/80 (BP Location: Left Arm, Patient  Position: Sitting)   Pulse 77   Ht 5\' 7"  (1.702 m)   Wt 146 lb (66.2 kg)   SpO2 98%   BMI 22.87 kg/m  Constitutional: Well-developed and well-nourished. No distress. HEENT: Normocephalic and atraumatic. Oropharynx is clear and moist. Conjunctivae are normal.  No scleral icterus. Neck: Neck supple. Trachea midline. Cardiovascular: Normal rate, regular rhythm and intact distal pulses. No M/R/G Pulmonary/chest: Effort normal and breath sounds normal. No wheezing, rales or rhonchi. Abdominal: Soft, nontender, nondistended. Bowel sounds active throughout. There are no masses palpable. No hepatosplenomegaly. Extremities: no clubbing, cyanosis, or edema Neurological: Alert and oriented to person place and time. Skin: Skin is warm and dry.  Psychiatric: Normal mood and affect. Behavior is normal.   ASSESSMENT/PLAN: 61 year old female with a past medical history of IBS, chronic recurrent right lower quadrant abdominal pain, GERD with hiatal hernia who is seen in consult at the request of Dr. Earle Gell to establish care for her GI issues upon his retirement.  1. Right lower quadrant abdominal pain -- unclear etiology to this pain though it has been present at least since 2014. Prior evaluation with pelvic ultrasound, CT scan and colonoscopy have been unrevealing. This may be a musculoskeletal pain. It sounds spastic in nature. I recommended that she try Levsin 0.125 g, 1-2 sublingual tablets every 6-8 hours as needed for this pain. If this doesn't help and the pain worsens we could consider amitriptyline 25-50 mg daily at bedtime.  2. GERD with hiatal hernia -- well-controlled without alarm symptom. Continue Zantac 75 twice a day. Continue prelief per box instructions as needed  3. IBS -- controlled with diet and lifestyle modifications. Can continue to avoid lactose products. Levsin can be used as needed as described above.  4. Colon cancer screening -- repeat colonoscopy recommended November  2023 for screening  She will follow-up as needed    UL:AGTXMIW, Alysia Penna, Md [phone] [address] [place], [place] [address]   Dr. Earle Gell

## 2017-07-29 ENCOUNTER — Ambulatory Visit: Payer: 59 | Admitting: Internal Medicine

## 2017-07-29 ENCOUNTER — Encounter: Payer: Self-pay | Admitting: Internal Medicine

## 2017-07-29 VITALS — BP 132/78 | HR 72 | Ht 66.5 in | Wt 149.5 lb

## 2017-07-29 DIAGNOSIS — R198 Other specified symptoms and signs involving the digestive system and abdomen: Secondary | ICD-10-CM | POA: Diagnosis not present

## 2017-07-29 DIAGNOSIS — K581 Irritable bowel syndrome with constipation: Secondary | ICD-10-CM

## 2017-07-29 DIAGNOSIS — R140 Abdominal distension (gaseous): Secondary | ICD-10-CM | POA: Diagnosis not present

## 2017-07-29 DIAGNOSIS — G8929 Other chronic pain: Secondary | ICD-10-CM

## 2017-07-29 DIAGNOSIS — R1031 Right lower quadrant pain: Secondary | ICD-10-CM

## 2017-07-29 NOTE — Patient Instructions (Signed)
Discontinue Align.  Please purchase the following medications over the counter and take as directed: Benefiber 1 teaspoon daily, working your way to 1 tablespoon daily.  You have been referred for a small intestine bacterial overgrowth test (SIBO) which is completed by a company named [facility]. Your demographic and insurance information has been sent to this company and they should be in contact with you over the next week regarding this test. Please keep in mind that you will be getting this call from phone number [phone] or a similar number. If you do not hear from them within this time frame, please call our office at [phone].   Please follow up with Dr Hilarie Fredrickson in 3 months.  If you are age 40 or older, your body mass index should be between 23-30. Your Body mass index is 23.77 kg/m. If this is out of the aforementioned range listed, please consider follow up with your Primary Care Provider.  If you are age 42 or younger, your body mass index should be between 19-25. Your Body mass index is 23.77 kg/m. If this is out of the aformentioned range listed, please consider follow up with your Primary Care Provider.

## 2017-07-29 NOTE — Progress Notes (Signed)
Subjective:    Patient ID: Margaret Miles, female    DOB: July 20, 1956, 61 y.o.   MRN: [id number]  HPI Margaret Miles is a 61 year old female with a history of IBS, chronic recurrent right lower quadrant abdominal pain, GERD with hiatal hernia who is here for follow-up.  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  She is here alone today.  She made this appointment because she has noticed some increase in her right lower quadrant abdominal pain and has had what she feels is a change in her bowel habits.  Her stools vary from loose to pasty to at times thin.  No blood or melena.  She does have a history of hemorrhoids and uses preparation wipes with each bowel movement.  She usually has a bowel movement once per day rarely twice per day.  One day per week she feels constipated.  She has noticed increased gas and bloating and has been using Beano more frequently recently.  She uses Align daily which she has done for years as a probiotic.  She occasionally uses the Levsin for crampy abdominal pain and she finds this helpful.  She is using this infrequently less than 1 day a week on average.  She saw Dr. Matthew Saras her gynecologist and will have a repeat transvaginal ultrasound on 08/10/2017 to rule out an ovarian source for her right lower quadrant pain.  She continues ranitidine 75 mg twice daily which works well for her heartburn and indigestion symptoms.  She had an incomplete colonoscopy in 2008 with a negative barium enema shortly thereafter and a normal colonoscopy in 2013.  There is no family history of colon cancer.  Review of Systems As per HPI, otherwise negative  Current Medications, Allergies, Past Medical History, Past Surgical History, Family History and Social History were reviewed in [employer] record.     Objective:   Physical Exam BP  132/78 (BP Location: Left Arm, Patient Position: Sitting, Cuff Size: Normal)   Pulse 72   Ht 5' 6.5" (1.689 m) Comment: height measured without shoes  Wt 149 lb 8 oz (67.8 kg)   BMI 23.77 kg/m  Constitutional: Well-developed and well-nourished. No distress. HEENT: Normocephalic and atraumatic.   No scleral icterus. Neck: Neck supple. Trachea midline. Abdominal: Soft, nontender, nondistended. Bowel sounds active throughout Neurological: Alert and oriented to person place and time. Skin: Skin is warm and dry. Psychiatric: Normal mood and affect. Behavior is normal.     Assessment & Plan:  61 year old female with a history of IBS, chronic recurrent right lower quadrant abdominal pain, GERD with hiatal hernia who is here for follow-up.  1.  Right lower quadrant abdominal pain/altered bowel function/abdominal gas and bloating --my suspicion is that she is having constipation and overfilling of the colon leading to her right lower quadrant abdominal pain.  She also endorses incomplete evacuation at times.  This is a chronic pain for her and she is up-to-date with colonoscopy.  She also had a CT scan performed for this very pain in 2014 which showed no concerning findings but moderate amount of feces in the colon.  We discussed this at length today.  She is concerned about the colon cancer because she states since having brain cancer she worries about these things.  We discussed and will proceed as follows --Hold probiotic for about a month; this could be increasing/worsening abdominal gas and bloating --Begin  Benefiber 1 heaping teaspoon working up to 1 heaping tablespoon daily in an attempt to bulk stool and improve constipation --SIBO breath testing to rule out bacterial overgrowth --Continue Beano and/or Gas-X per box instruction --We discussed repeating the colonoscopy, Cologuard or fit testing.  She would like to avoid colonoscopy if at all possible.  She will discuss the 2 options for stool  testing with her husband and notify me about how she wishes to proceed.  She also may benefit from MiraLAX, or another laxative, depending on how she responds to Benefiber. I will see her back in 2-3 months, sooner if needed for reassessment  25 minutes spent with the patient today. Greater than 50% was spent in counseling and coordination of care with the patient

## 2017-09-19 ENCOUNTER — Telehealth: Payer: Self-pay | Admitting: *Deleted

## 2017-09-19 NOTE — Telephone Encounter (Signed)
Dr Hilarie Fredrickson has received patient's Small Intestinal Bacterial Overgrowth test back from [facility]. Per Dr Hilarie Fredrickson on 09/15/17, "SIBO +. Please treat with rifaxamin 550 mg three times daily x 14 days. This should improve abdominal bloating and possibly altered bowel function. Have her call with results after treatment.  I have left a voicemail for patient to call back.

## 2017-09-20 MED ORDER — RIFAXIMIN 550 MG PO TABS: 550.0000 mg | ORAL_TABLET | Freq: Three times a day (TID) | ORAL | 0 refills | Status: DC

## 2017-09-20 NOTE — Telephone Encounter (Signed)
Patient returning phone call best call back# [phone]

## 2017-09-20 NOTE — Telephone Encounter (Signed)
I have spoken to patient to advise of SIBO results. She verbalizes understanding and will call back with symptom update. Per Dr Hilarie Fredrickson, pt to hold off on taking her benefiber 1 teaspoon until after she takes xifaxan. She verbalizes understanding of this as well.

## 2017-09-21 ENCOUNTER — Telehealth: Payer: Self-pay | Admitting: Internal Medicine

## 2017-09-21 NOTE — Telephone Encounter (Signed)
I have spoken to patient and have answered her questions regarding xifaxan.

## 2017-10-05 ENCOUNTER — Telehealth: Payer: Self-pay | Admitting: Internal Medicine

## 2017-10-05 NOTE — Telephone Encounter (Signed)
Pt taking xifaxan for sibo and states that she thinks it is causing her constipation. She was told not to take her benefiber while she was taking the xifaxan. States she finally had a bm after drinking tons of water, it was 3 days before she had the bm and it was quite hard. Please advise, she should finish the xifaxan on 10/14/17.

## 2017-10-05 NOTE — Telephone Encounter (Signed)
The rifaximin can cause the constipation, as it is altering the bowel flora  She had SIBO and thus the rifaximin should help improve this issue I would recommend that she use MiraLax 17g daily for now and when needed, esp while completing the rifaximin If this is not helpful, have her let us know

## 2017-10-05 NOTE — Telephone Encounter (Signed)
Spoke with pt and she is aware.

## 2017-10-31 ENCOUNTER — Telehealth: Payer: Self-pay | Admitting: Internal Medicine

## 2017-10-31 NOTE — Telephone Encounter (Signed)
Would recommend she begin Benefiber 1 tablespoon daily.  Mix this in 8-12 ounces of fluid. Office follow-up with me next available if not already scheduled

## 2017-10-31 NOTE — Telephone Encounter (Signed)
Pt finished xifaxan 10/08/17 and continues to have 1 episode daily of diarrhea.  She is taking Electronics engineer.  She has been sick with cough/cold with fever.  Not sure what else she needs to do.  Please advise

## 2017-10-31 NOTE — Telephone Encounter (Signed)
The pt is aware and will begin benefiber and follow up scheduled

## 2017-11-14 ENCOUNTER — Telehealth: Payer: Self-pay | Admitting: Internal Medicine

## 2017-11-14 NOTE — Telephone Encounter (Signed)
Patient wants to know if she is suppose to restart probiotics before her follow up with Dr.Pyrtle in May.

## 2017-11-14 NOTE — Telephone Encounter (Signed)
Patient states that she restarted her probiotics this past week after being sick with what sounds like a URI. Advised that she may continue probiotics until seen. Dr Vena Rua last note indicates he wanted her to hold probiotics x 1 month from her last office visit which was in January. She did do that and took Xifaxan for SIBO. Patient verbalizes understanding.

## 2017-12-16 ENCOUNTER — Encounter: Payer: Self-pay | Admitting: *Deleted

## 2018-01-17 ENCOUNTER — Ambulatory Visit: Payer: 59 | Admitting: Internal Medicine

## 2018-01-17 VITALS — BP 118/80 | HR 74 | Ht 67.0 in | Wt 150.4 lb

## 2018-01-17 DIAGNOSIS — K6389 Other specified diseases of intestine: Secondary | ICD-10-CM

## 2018-01-17 DIAGNOSIS — K589 Irritable bowel syndrome without diarrhea: Secondary | ICD-10-CM | POA: Diagnosis not present

## 2018-01-17 DIAGNOSIS — K219 Gastro-esophageal reflux disease without esophagitis: Secondary | ICD-10-CM | POA: Diagnosis not present

## 2018-01-17 NOTE — Patient Instructions (Signed)
Continue your probiotic.  Continue your ranitidine.  Follow up as needed.  If you are age 60 or older, your body mass index should be between 23-30. Your Body mass index is 23.55 kg/m. If this is out of the aforementioned range listed, please consider follow up with your Primary Care Provider.  If you are age 85 or younger, your body mass index should be between 19-25. Your Body mass index is 23.55 kg/m. If this is out of the aformentioned range listed, please consider follow up with your Primary Care Provider.

## 2018-01-17 NOTE — Progress Notes (Signed)
   Subjective:    Patient ID: Margaret Miles, female    DOB: 1955/11/29, 62 y.o.   MRN: [id number]  HPI Margaret Miles is a 62 yo female with PMH of IBS, chronic right lower quadrant abdominal pain, history of GERD with hiatal hernia, and SIBO who is here for follow-up.  She also has a history of glioblastoma multiform a status post resection, Graves' disease now with hypothyroidism, IC and insomnia.  She is here alone today and was last seen on 07/29/2017  At her last visit she underwent hydrogen breath testing which was positive for SIBO.  She completed a course of rifaximin.  Most recently she has been feeling well.  She stopped Benefiber because she realized that this was causing increase in flatulence, bloating, stomach aching and loose stools.  She stopped this on 01/05/2018.  She also resumed her probiotic, Align, 1 capsule daily.  Bowel habits recently have been regular occurring 1-2 times daily.  Not hard, can be soft.  Nonbloody non-melenic.  Drinks 20 ounces of coffee each morning.  Evacuation has felt complete recently.  Her right lower quadrant pain seems to come and go, most prominent after intercourse.  No other triggers.  If anything less prominent than in months past.  Heartburn well controlled with ranitidine 75 twice daily.  No dysphagia or odynophagia.  Avoids foods which trigger her heartburn such as onions and spicy foods.  Using Lactaid products.  Has noticed some hair loss and plans to contact primary care to consider thyroid testing  Review of Systems As per HPI, otherwise negative  Current Medications, Allergies, Past Medical History, Past Surgical History, Family History and Social History were reviewed in [employer] record.     Objective:   Physical Exam There were no vitals taken for this visit. Constitutional: Well-developed and well-nourished. No distress. HEENT: Normocephalic and atraumatic. Oropharynx is clear and moist. Conjunctivae  are normal.  No scleral icterus. Neck: Neck supple. Trachea midline. Cardiovascular: Normal rate, regular rhythm and intact distal pulses. No M/R/G Pulmonary/chest: Effort normal and breath sounds normal. No wheezing, rales or rhonchi. Abdominal: Soft, nontender, nondistended. Bowel sounds active throughout. There are no masses palpable. No hepatosplenomegaly. Extremities: no clubbing, cyanosis, or edema Neurological: Alert and oriented to person place and time. Skin: Skin is warm and dry. Psychiatric: Normal mood and affect. Behavior is normal.     Assessment & Plan:  61 yo female with PMH of IBS, chronic right lower quadrant abdominal pain, history of GERD with hiatal hernia, and SIBO who is here for follow-up.   1.  SIBO --treated with rifaximin and doing well.  Benefiber contributed to gas and bloating and she has discontinued this.  Continue Align once daily   2.  IBS --also complicated by #1, now status post treatment with rifaximin.  Continue daily probiotic and avoidance of Benefiber given it contributing to flatulence bloating and stomachache.  Continue lactose avoidance/Lactaid products  3.  GERD --well-controlled without alarm symptoms.  Continue Zantac 75 mg twice daily  4.  CRC screening --normal colonoscopy in 2013.  No change in bowel habit or alarm symptoms at present and therefore I do not think we need to repeat colonoscopy at this time.  Repeat screening would be recommended in 2023.  Follow-up as needed 25 minutes spent with the patient today. Greater than 50% was spent in counseling and coordination of care with the patient

## 2018-07-19 ENCOUNTER — Telehealth: Payer: Self-pay | Admitting: Internal Medicine

## 2018-07-19 NOTE — Telephone Encounter (Signed)
Pt wants to try a different med for gerd since ranitidine was recalled. She stated that she needs something that it is not time released because she takes synthroid, she said that it cannot be nexium for that reason, she is also a cancer survivor so wants to take this into account as well.

## 2018-07-19 NOTE — Telephone Encounter (Signed)
Spoke with pt and let her know she can take pepcid 10mg  bid.

## 2020-01-02 ENCOUNTER — Other Ambulatory Visit: Payer: Self-pay | Admitting: Obstetrics and Gynecology

## 2020-01-02 DIAGNOSIS — R928 Other abnormal and inconclusive findings on diagnostic imaging of breast: Secondary | ICD-10-CM

## 2020-01-17 ENCOUNTER — Other Ambulatory Visit: Payer: Self-pay

## 2020-01-17 ENCOUNTER — Ambulatory Visit
Admission: RE | Admit: 2020-01-17 | Discharge: 2020-01-17 | Disposition: A | Discharge status: Home / Self Care | Payer: 59 | Source: Ambulatory Visit | Attending: Obstetrics and Gynecology | Admitting: Obstetrics and Gynecology

## 2020-01-17 ENCOUNTER — Other Ambulatory Visit: Payer: Self-pay | Admitting: Obstetrics and Gynecology

## 2020-01-17 DIAGNOSIS — R928 Other abnormal and inconclusive findings on diagnostic imaging of breast: Secondary | ICD-10-CM

## 2020-01-25 ENCOUNTER — Other Ambulatory Visit: Payer: Self-pay

## 2020-01-25 ENCOUNTER — Ambulatory Visit
Admission: RE | Admit: 2020-01-25 | Discharge: 2020-01-25 | Disposition: A | Discharge status: Home / Self Care | Payer: 59 | Source: Ambulatory Visit | Attending: Obstetrics and Gynecology | Admitting: Obstetrics and Gynecology

## 2020-01-25 DIAGNOSIS — R928 Other abnormal and inconclusive findings on diagnostic imaging of breast: Secondary | ICD-10-CM

## 2020-05-14 ENCOUNTER — Telehealth: Payer: Self-pay | Admitting: Internal Medicine

## 2020-05-14 NOTE — Telephone Encounter (Signed)
Pt states that she has been having a hard time with her hemorrhoids, she stated that she had a cold last week so she spent long periods of time laying down and sitting so she is not sure if that exacerbated it. She would like some advise on what she can do.

## 2020-05-14 NOTE — Telephone Encounter (Signed)
Pt having pain from hemorrhoids. Discussed with her she can soak in a warm tub for 69min twice a day, use tucs, OTC prep H cream and Recticare. Pt verbalized understanding.

## 2020-06-02 NOTE — Telephone Encounter (Signed)
Pt states she has been using the following for her hemorrhoids:  Soak in a warm tub for 42min twice a day, use tucs, OTC prep H cream and Recticare  Pt is calling requesting a prescription med for her hemorrhoids. Please advise.

## 2020-06-02 NOTE — Telephone Encounter (Addendum)
Pt states that she has been following Dr. Vena Rua recommendations and she has experienced some relief. However, she would like something prescribed that can help solve issues faster. She uses [facility].

## 2020-06-03 MED ORDER — HYDROCORTISONE ACETATE 25 MG RE SUPP: 25.0000 mg | Freq: Every day | RECTAL | 0 refills | Status: DC

## 2020-06-03 NOTE — Addendum Note (Signed)
Addended by: Rosanne Sack R on: 06/03/2020 09:48 AM   Modules accepted: Orders

## 2020-06-03 NOTE — Telephone Encounter (Signed)
Script sent to pharmacy. Pt aware. 

## 2020-06-03 NOTE — Telephone Encounter (Signed)
Hydrocortisone supp 25 qHS daily x5 days

## 2020-07-25 ENCOUNTER — Ambulatory Visit: Payer: 59 | Admitting: Physician Assistant

## 2020-07-25 ENCOUNTER — Encounter: Payer: Self-pay | Admitting: Physician Assistant

## 2020-07-25 VITALS — BP 146/74 | HR 80 | Ht 67.0 in | Wt 154.0 lb

## 2020-07-25 DIAGNOSIS — K648 Other hemorrhoids: Secondary | ICD-10-CM | POA: Diagnosis not present

## 2020-07-25 DIAGNOSIS — K644 Residual hemorrhoidal skin tags: Secondary | ICD-10-CM | POA: Diagnosis not present

## 2020-07-25 DIAGNOSIS — K589 Irritable bowel syndrome without diarrhea: Secondary | ICD-10-CM | POA: Diagnosis not present

## 2020-07-25 MED ORDER — HYDROCORTISONE ACETATE 25 MG RE SUPP: 25.0000 mg | Freq: Every evening | RECTAL | 4 refills | Status: AC

## 2020-07-25 NOTE — Patient Instructions (Signed)
If you are age 62 or older, your body mass index should be between 23-30. Your Body mass index is 24.86 kg/m. If this is out of the aforementioned range listed, please consider follow up with your Primary Care Provider.  If you are age 17 or younger, your body mass index should be between 19-25. Your Body mass index is 24.86 kg/m. If this is out of the aformentioned range listed, please consider follow up with your Primary Care Provider.   START Hydrocortisone suppository 1 suppository in the rectum at bedtime for 7 days as needed  Start RectiCare Complete 3-4 times daily as needed for hemorrhoid symptoms.  Call the office to schedule a follow up in 2 months if your symptoms have not resolved.   Thank you for entrusting me with your care and choosing [facility].  Amy Esterwood, PA-C

## 2020-07-25 NOTE — Progress Notes (Signed)
Subjective:    Patient ID: Margaret Miles, female    DOB: [birth year], 64 y.o.   MRN: [id number]  HPI [name] is a pleasant 64 year old white female, established with Dr. Hilarie Fredrickson who comes in today with primary complaint of anorectal discomfort.  She is also concerned about her IBS symptoms and exacerbating issues by upcoming tooth extraction and tooth implant. Patient last had colonoscopy in 2013 by prior notes which was negative and she is indicated for 10-year interval follow-up. She has been followed for chronic GERD, IBS and has had treatment for SIBO with Xifaxan in 2019. Over the past several months she has been having some intermittent rectal pressure or discomfort which she says is anterior and sometimes it is hard to tell whether this is vaginal or rectal.  She will have discomfort at x4 sitting for a long period of time.  She has not noticed any pain with defecation.  And no bleeding.  She says most of her bowel movements are softer on the looser side so she has no issues with constipation or straining.  She will occasionally have a spasming sensation in the rectum as well. She had used some over-the-counter suppositories but was afraid to continue on these due to the warnings on the package.  She describes a burning sensation at times. She feels that the external hemorrhoids have been more active within the past couple of months and she had been using RectiCare, Preparation H and is currently using coconut oil for any hemorrhoidal irritation though she says the external hemorrhoids have not been bothering her recently. She also had recent evaluation by her gynecologist and says that that exam was unremarkable and she was placed on a vaginal cream for atrophy.    Review of Systems Pertinent positive and negative review of systems were noted in the above HPI section.  All other review of systems was otherwise negative.  Outpatient Encounter Medications as of 07/25/2020  Medication Sig    . Alpha-D-Galactosidase (BEANO PO) Take by mouth as needed.  . AMBULATORY NON FORMULARY MEDICATION Medication Name: Estradiol cream compounded use 2 x a week  . clonazePAM (KLONOPIN) 1 MG tablet Take 1 mg by mouth daily.  Marland Kitchen gabapentin (NEURONTIN) 100 MG capsule Take 200 mg by mouth at bedtime.  Marland Kitchen Hyoscyamine Sulfate SL (LEVSIN/SL) 0.125 MG SUBL Take 1-2 tablets by mouth every 6 hours as needed  . Lactase (LACTAID PO) Take by mouth daily.  Marland Kitchen levothyroxine (SYNTHROID) 88 MCG tablet Take 88 mcg by mouth daily before breakfast. On Monday, Wednesday and Friday  . levothyroxine (SYNTHROID, LEVOTHROID) 112 MCG tablet Take 100 mcg by mouth daily.   . ranitidine (ZANTAC) 75 MG tablet Take 75 mg by mouth 2 (two) times daily.  . hydrocortisone (ANUSOL-HC) 25 MG suppository Place 1 suppository (25 mg total) rectally at bedtime for 7 days.  . [DISCONTINUED] estradiol (ESTRACE VAGINAL) 0.1 MG/GM vaginal cream Place 1 Applicatorful vaginally 2 (two) times daily.  . [DISCONTINUED] hydrocortisone (ANUSOL-HC) 25 MG suppository Place 1 suppository (25 mg total) rectally at bedtime.   No facility-administered encounter medications on file as of 07/25/2020.   Allergies  Allergen Reactions  . Erythromycin Nausea Only  . Tessalon [Benzonatate] Other (See Comments)    vertigo  . Codeine Anxiety  . Sulfa Antibiotics Rash  . Voltaren [Diclofenac Sodium] Rash   There are no problems to display for this patient.  Social History   Socioeconomic History  . Marital status: Married    Spouse name:  Not on file  . Number of children: Not on file  . Years of education: Not on file  . Highest education level: Not on file  Occupational History  . Not on file  Tobacco Use  . Smoking status: Never Smoker  . Smokeless tobacco: Never Used  Substance and Sexual Activity  . Alcohol use: Yes  . Drug use: Yes  . Sexual activity: Not on file  Other Topics Concern  . Not on file  Social History Narrative  . Not on  file   Social Determinants of Health   Financial Resource Strain:   . Difficulty of Paying Living Expenses: Not on file  Food Insecurity:   . Worried About Charity fundraiser in the Last Year: Not on file  . Ran Out of Food in the Last Year: Not on file  Transportation Needs:   . Lack of Transportation (Medical): Not on file  . Lack of Transportation (Non-Medical): Not on file  Physical Activity:   . Days of Exercise per Week: Not on file  . Minutes of Exercise per Session: Not on file  Stress:   . Feeling of Stress : Not on file  Social Connections:   . Frequency of Communication with Friends and Family: Not on file  . Frequency of Social Gatherings with Friends and Family: Not on file  . Attends Religious Services: Not on file  . Active Member of Clubs or Organizations: Not on file  . Attends Archivist Meetings: Not on file  . Marital Status: Not on file  Intimate Partner Violence:   . Fear of Current or Ex-Partner: Not on file  . Emotionally Abused: Not on file  . Physically Abused: Not on file  . Sexually Abused: Not on file    Ms. Agostino's family history includes ALS in her mother; Breast cancer in her maternal grandmother; Diabetes in her father; Hypertension in her father; Hypothyroidism in her sister.      Objective:    Vitals:   07/25/20 1433  BP: (!) 146/74  Pulse: 80    Physical Exam Well-developed well-nourished older white female in no acute distress.  Height, Weight, 154 BMI 24.8  HEENT; nontraumatic normocephalic, EOMI, PER R LA, sclera anicteric. Oropharynx; not examined Neck; supple, no JVD Cardiovascular; regular rate and rhythm with S1-S2, no murmur rub or gallop Pulmonary; Clear bilaterally Abdomen; soft, nontender, nondistended, no palpable mass or hepatosplenomegaly, bowel sounds are active Rectal; 2 small noninflamed external hemorrhoids, digital exam is negative no palpable fissure, on anoscopy she has a small anterior internal  hemorrhoid which is inflamed. Skin; benign exam, no jaundice rash or appreciable lesions Extremities; no clubbing cyanosis or edema skin warm and dry Neuro/Psych; alert and oriented x4, grossly nonfocal mood and affect appropriate      Assessment & Plan:   #18 64 year old white female with intermittently symptomatic external hemorrhoids, and 1 currently symptomatic internal hemorrhoid.  #2 colon cancer surveillance-last colonoscopy 2013 - due for 10-year interval follow-up in 2023 3.  IBS with diarrhea predominance and previous successful treatment for SIBO with Xifaxan.  Symptoms stable currently, we discussed continuing probiotic around the time of her tooth extraction and implant, use of Imodium as needed, and increasing famotidine to twice daily if she needs to use anti-inflammatories.  #4 GERD stable #5 prior history of glioblastoma with resection  Plan; start Anusol HC suppositories nightly x7 days, then repeat a 5 to 7-day course at bedtime as needed for recurrent symptoms. RectiCare complete  apply 3-4 times daily as needed for external hemorrhoidal symptoms. Okay to use coconut oil prophylactically. Patient is asked to call back if her symptoms are persisting and not improving with the above regimen.  If she continues to complain of rectal discomfort, may opt to do her surveillance colonoscopy sooner. Continue famotidine 20 mg p.o. daily, may increase to twice daily as needed   Alfredia Ferguson PA-C 07/25/2020   Cc: Lavone Orn, MD

## 2020-07-25 NOTE — Progress Notes (Signed)
Addendum: Reviewed and agree with assessment and management plan. Damare Serano M, MD  

## 2020-07-28 ENCOUNTER — Telehealth: Payer: Self-pay | Admitting: Physician Assistant

## 2020-07-28 NOTE — Telephone Encounter (Signed)
Pt Is requesting a call back from a nurse to correct her height, pt states this has affected her body mass.

## 2020-07-28 NOTE — Telephone Encounter (Signed)
Patient has been contacted and her height has been adjusted.

## 2020-12-25 ENCOUNTER — Ambulatory Visit: Payer: 59 | Admitting: Physician Assistant

## 2020-12-25 ENCOUNTER — Encounter: Payer: Self-pay | Admitting: Physician Assistant

## 2020-12-25 VITALS — BP 154/80 | HR 80 | Ht 66.5 in | Wt 151.4 lb

## 2020-12-25 DIAGNOSIS — K581 Irritable bowel syndrome with constipation: Secondary | ICD-10-CM | POA: Diagnosis not present

## 2020-12-25 DIAGNOSIS — E739 Lactose intolerance, unspecified: Secondary | ICD-10-CM

## 2020-12-25 DIAGNOSIS — K219 Gastro-esophageal reflux disease without esophagitis: Secondary | ICD-10-CM | POA: Diagnosis not present

## 2020-12-25 DIAGNOSIS — R1319 Other dysphagia: Secondary | ICD-10-CM | POA: Diagnosis not present

## 2020-12-25 NOTE — Patient Instructions (Addendum)
If you are age 63 or older, your body mass index should be between 23-30. Your Body mass index is 24.07 kg/m. If this is out of the aforementioned range listed, please consider follow up with your Primary Care Provider.  If you are age 60 or younger, your body mass index should be between 19-25. Your Body mass index is 24.07 kg/m. If this is out of the aformentioned range listed, please consider follow up with your Primary Care Provider.   You have been scheduled for an endoscopy. Please follow written instructions given to you at your visit today. If you use inhalers (even only as needed), please bring them with you on the day of your procedure.  pepcid 20 mg one a day in the morning   Continue Align daily.  Take Magnesium tablets as needed for constipation   It was a pleasure to see you today!  Thank you for trusting me with your gastrointestinal care!

## 2020-12-28 ENCOUNTER — Encounter: Payer: Self-pay | Admitting: Physician Assistant

## 2020-12-28 NOTE — Progress Notes (Signed)
Subjective:    Patient ID: Margaret Miles, female    DOB: [birth year], 65 y.o.   MRN: [id number]  HPI Margaret Miles is a pleasant 65 year old white female, established with Dr. Hilarie Miles who was last seen in December 2021 by myself.  She has history of IBS, hemorrhoids, hypothyroidism, prior glioblastoma status postresection. Last colonoscopy was done in 2013 by prior notes which was negative and indicated for 10-year interval follow-up.  She also has history of chronic GERD, no prior EGD here.  She is also had treatment for SIBO with Xifaxan in 2019 with good response. She comes in today stating that her hemorrhoids are better.  She continues to have some alternating bowel habits.  She has issues with constipation whenever she is out of her normal routine.  She says she is usually constipated on Sunday's and then the following day may have diarrhea.  She has started taking a magnesium tablet on Sunday evenings in order to have a bowel movement early in the day prior to church etc.  She has been using Lactaid tablets and avoids her known food triggers. She has been having some issues recently with solid food dysphagia and feeling as if her pills are sticking.  She had an episode after a gabapentin tablets having her esophagus for what she feels like with several hours.  She has not had any significant issues since no liquid dysphagia. She has been taking Pepcid 20 mg p.o. once daily. She mentions that she has an upcoming procedure to have a tooth implant and then will have a crown placed a couple of months later. She would like to have her esophagus evaluated.  Review of Systems Pertinent positive and negative review of systems were noted in the above HPI section.  All other review of systems was otherwise negative.  Outpatient Encounter Medications as of 12/25/2020  Medication Sig  . Alpha-D-Galactosidase (BEANO PO) Take by mouth as needed.  . AMBULATORY NON FORMULARY MEDICATION Medication Name:  Estradiol cream compounded use 2 x a week  . clonazePAM (KLONOPIN) 1 MG tablet Take 1 mg by mouth daily.  . famotidine (PEPCID) 20 MG tablet Take 20 mg by mouth daily.  Marland Kitchen gabapentin (NEURONTIN) 100 MG capsule Take 200 mg by mouth at bedtime.  Marland Kitchen Hyoscyamine Sulfate SL (LEVSIN/SL) 0.125 MG SUBL Take 1-2 tablets by mouth every 6 hours as needed  . Lactase (LACTAID PO) Take by mouth daily.  Marland Kitchen levothyroxine (SYNTHROID) 100 MCG tablet Take by mouth. Take 1 tablet 30 mins to 1 hour before breakfast Tues, Thurs, Sat, Sun  . levothyroxine (SYNTHROID) 88 MCG tablet Take 88 mcg by mouth daily before breakfast. On Monday, Wednesday and Friday  . Probiotic Product (ALIGN) 4 MG CAPS Take 1 capsule by mouth daily.  . [DISCONTINUED] levothyroxine (SYNTHROID, LEVOTHROID) 112 MCG tablet Take 100 mcg by mouth daily.   . [DISCONTINUED] ranitidine (ZANTAC) 75 MG tablet Take 75 mg by mouth 2 (two) times daily.   No facility-administered encounter medications on file as of 12/25/2020.   Allergies  Allergen Reactions  . Erythromycin Nausea Only  . Tessalon [Benzonatate] Other (See Comments)    vertigo  . Codeine Anxiety  . Sulfa Antibiotics Rash  . Voltaren [Diclofenac Sodium] Rash   There are no problems to display for this patient.  Social History   Socioeconomic History  . Marital status: Married    Spouse name: Not on file  . Number of children: Not on file  . Years of education: Not  on file  . Highest education level: Not on file  Occupational History  . Not on file  Tobacco Use  . Smoking status: Never Smoker  . Smokeless tobacco: Never Used  Substance and Sexual Activity  . Alcohol use: Yes  . Drug use: Yes  . Sexual activity: Not on file  Other Topics Concern  . Not on file  Social History Narrative  . Not on file   Social Determinants of Health   Financial Resource Strain: Not on file  Food Insecurity: Not on file  Transportation Needs: Not on file  Physical Activity: Not on file   Stress: Not on file  Social Connections: Not on file  Intimate Partner Violence: Not on file    Ms. Margaret Miles's family history includes ALS in her mother; Breast cancer in her maternal grandmother; Diabetes in her father; Hypertension in her father; Hypothyroidism in her sister.      Objective:    Vitals:   12/25/20 1508  BP: (!) 154/80  Pulse: 80    Physical Exam Well-developed well-nourished older white female in no acute distress.  Height, Weight, BMI 24.0  HEENT; nontraumatic normocephalic, EOMI, PE R LA, sclera anicteric. Oropharynx; not examined today Neck; supple, no JVD   Extremities; no clubbing cyanosis or edema skin warm and dry Neuro/Psych; alert and oriented x4, grossly nonfocal mood and affect appropriate       Assessment & Plan:   #5 65 year old white female with history of chronic GERD, maintained on Pepcid 20 mg p.o. daily female with recent issues with mild solid food dysphagia and pill dysphagia. Rule out peptic stricture, rule out esophageal ring, rule out dysmotility  #2 IBS-prior treatment for SIBO 2019 with good response with Xifaxan Continues with mild alternating bowel habits. #3.   Colon cancer surveillance-up-to-date will be due for colonoscopy 2023 #4 goiter #5 remote glioblastoma status post resection  Plan; continue Pepcid 20 mg p.o. every morning Scheduled for EGD with possible esophageal dilation with Dr. Hilarie Miles.  Procedure was discussed in detail with patient including indications risk and benefits and she is agreeable to proceed. Continue lactose avoidance and use of Lactaid tablets, continue avoidance of known triggers Continue magnesium as needed to avoid mild constipation episodes.  Margaret Hemrick Genia Harold PA-C 12/28/2020   Cc: Margaret Orn, MD

## 2021-01-16 NOTE — Progress Notes (Signed)
Addendum: Reviewed and agree with assessment and management plan. Emmylou Bieker M, MD  

## 2021-02-11 ENCOUNTER — Telehealth: Payer: Self-pay | Admitting: Internal Medicine

## 2021-02-11 NOTE — Telephone Encounter (Signed)
Inbound call from patient. States she had a hard fall from a chair and hit a footboard on 6/14. She have not went to the doctor so she does not know if anything is broken. She is calling because she have a EDG on 7/7. But she states it hurts to breathe and painful when laying down. Wants to know will it be okay for her procedure? Best contact number [phone]

## 2021-02-11 NOTE — Telephone Encounter (Signed)
Discussed with pt that she needs to see her PCP before the procedure is done especially since it hurts to breathe and lay down. DIscussed with her she will be laying down during the whole procedure. Pt will call her PCP and let us know if she cannot be seen prior to the procedure, if not procedure will need to be moved out.

## 2021-02-16 ENCOUNTER — Encounter: Payer: Self-pay | Admitting: Internal Medicine

## 2021-02-18 IMAGING — MG MM BREAST LOCALIZATION CLIP
4 series · 4 of 12 positions shown · non-contrast
Comparison: Previous exam(s).

CLINICAL DATA: Patient status post stereotactic guided biopsy left
breast asymmetry.

EXAM:
DIAGNOSTIC LEFT MAMMOGRAM POST STEREOTACTIC BIOPSY

[L CC synth-2D]
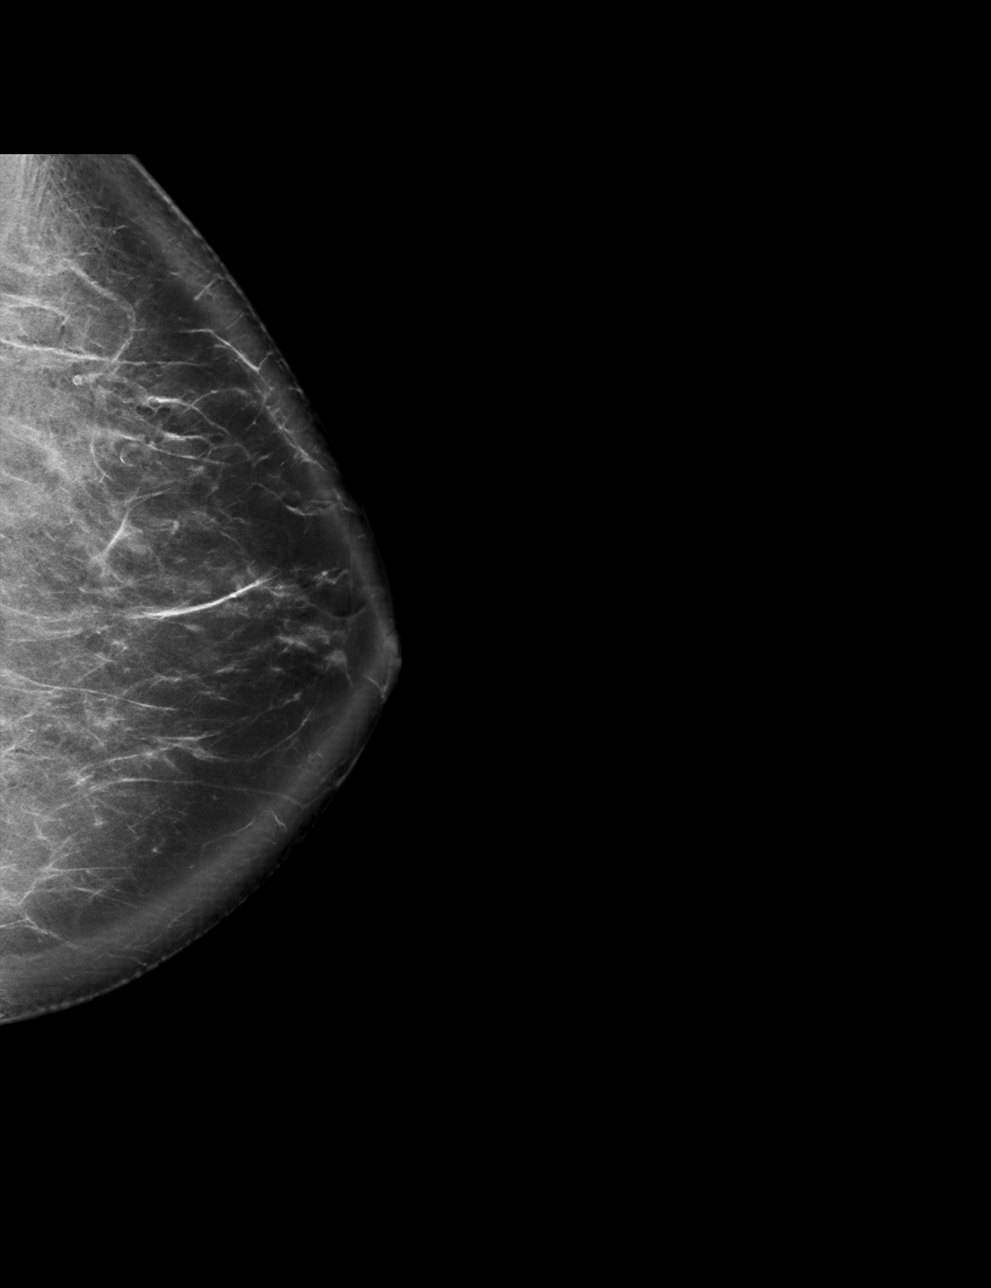

[L LM synth-2D]
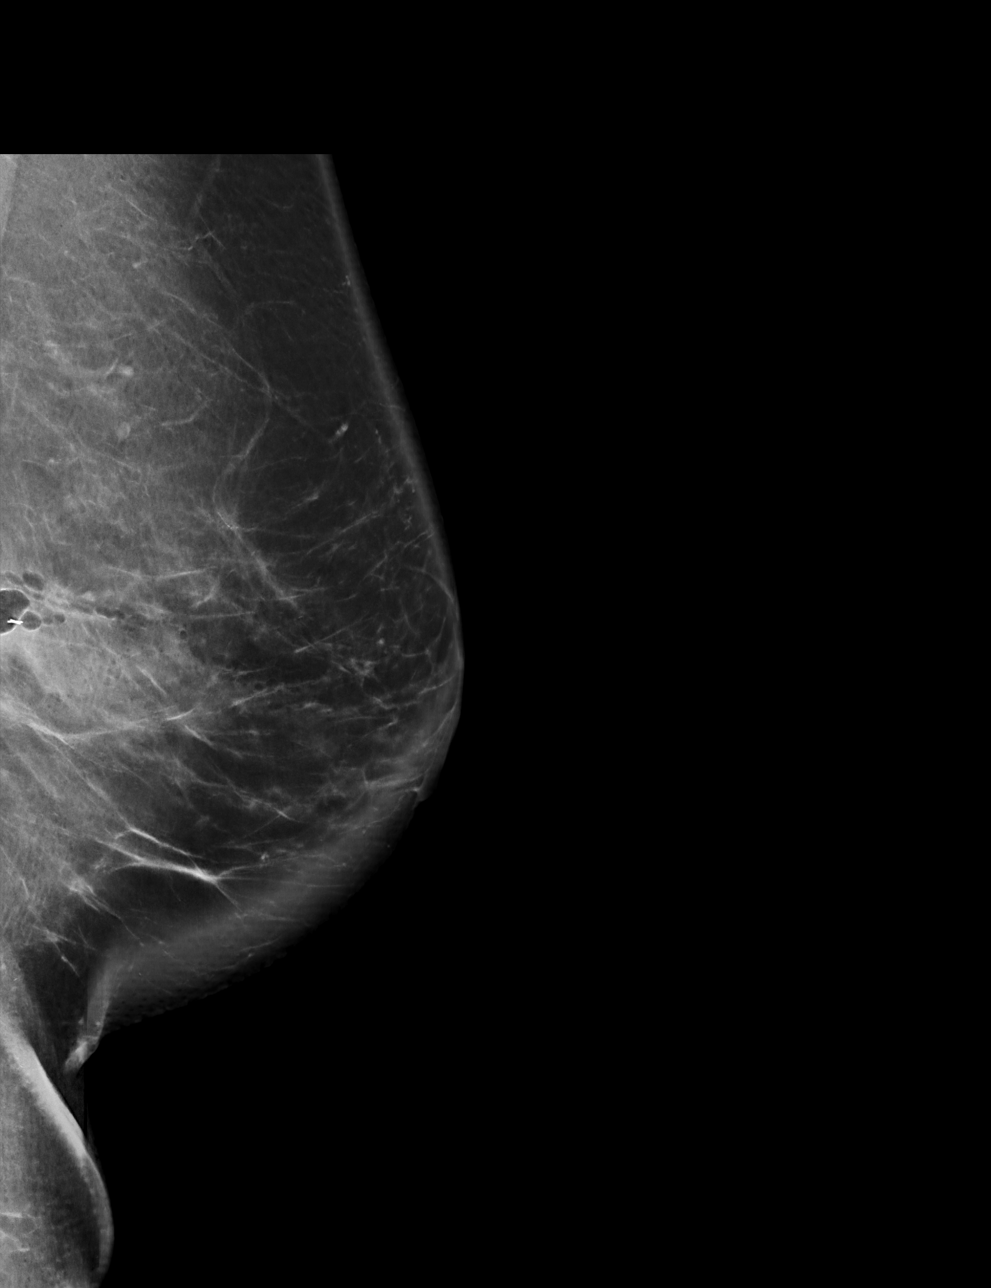

[L CC tomo · tomo slice 51/100.0]
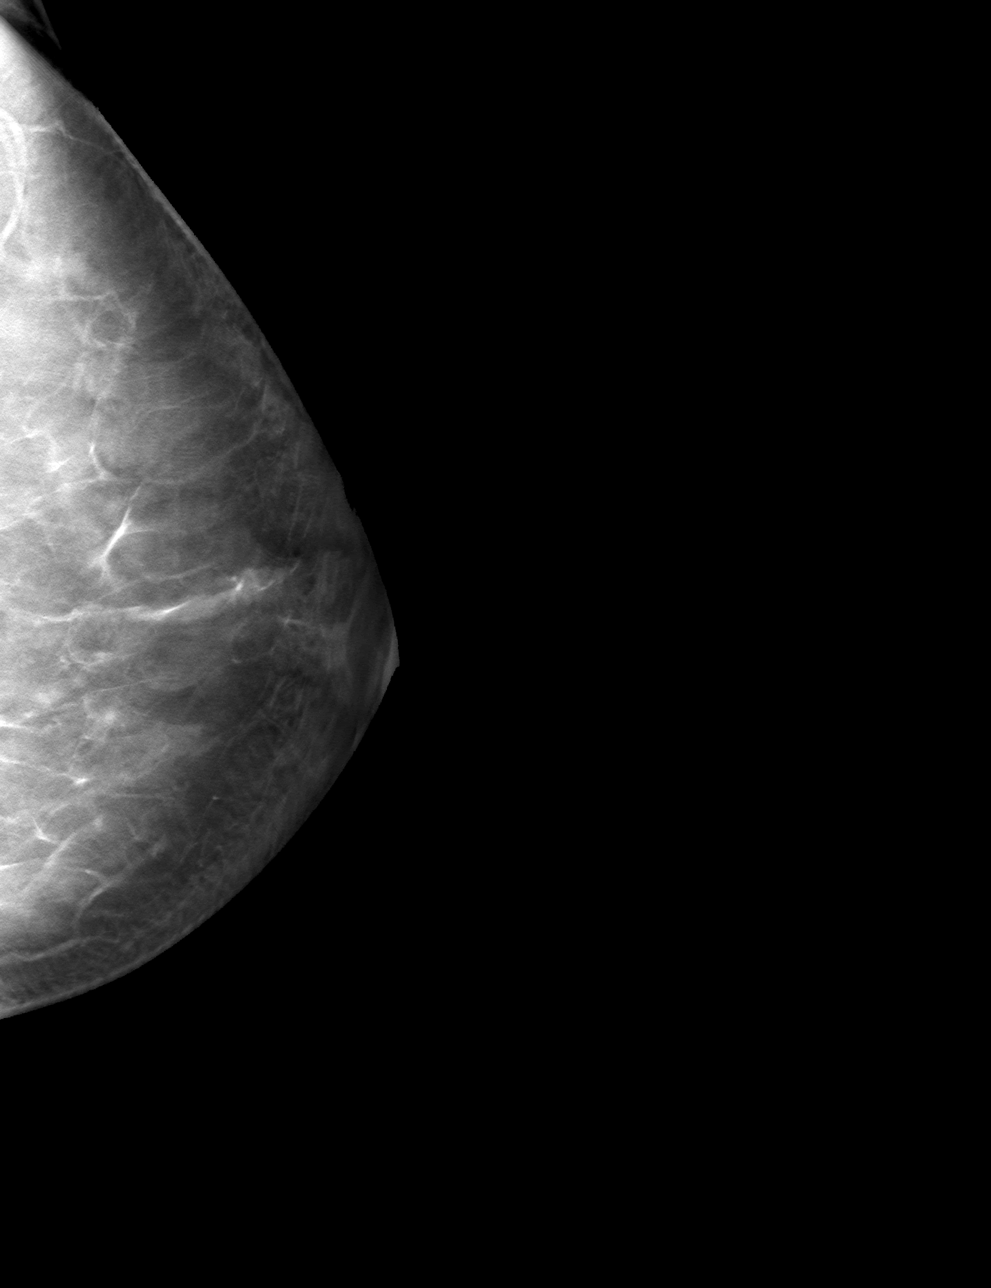

[L LM tomo · tomo slice 53/104.0]
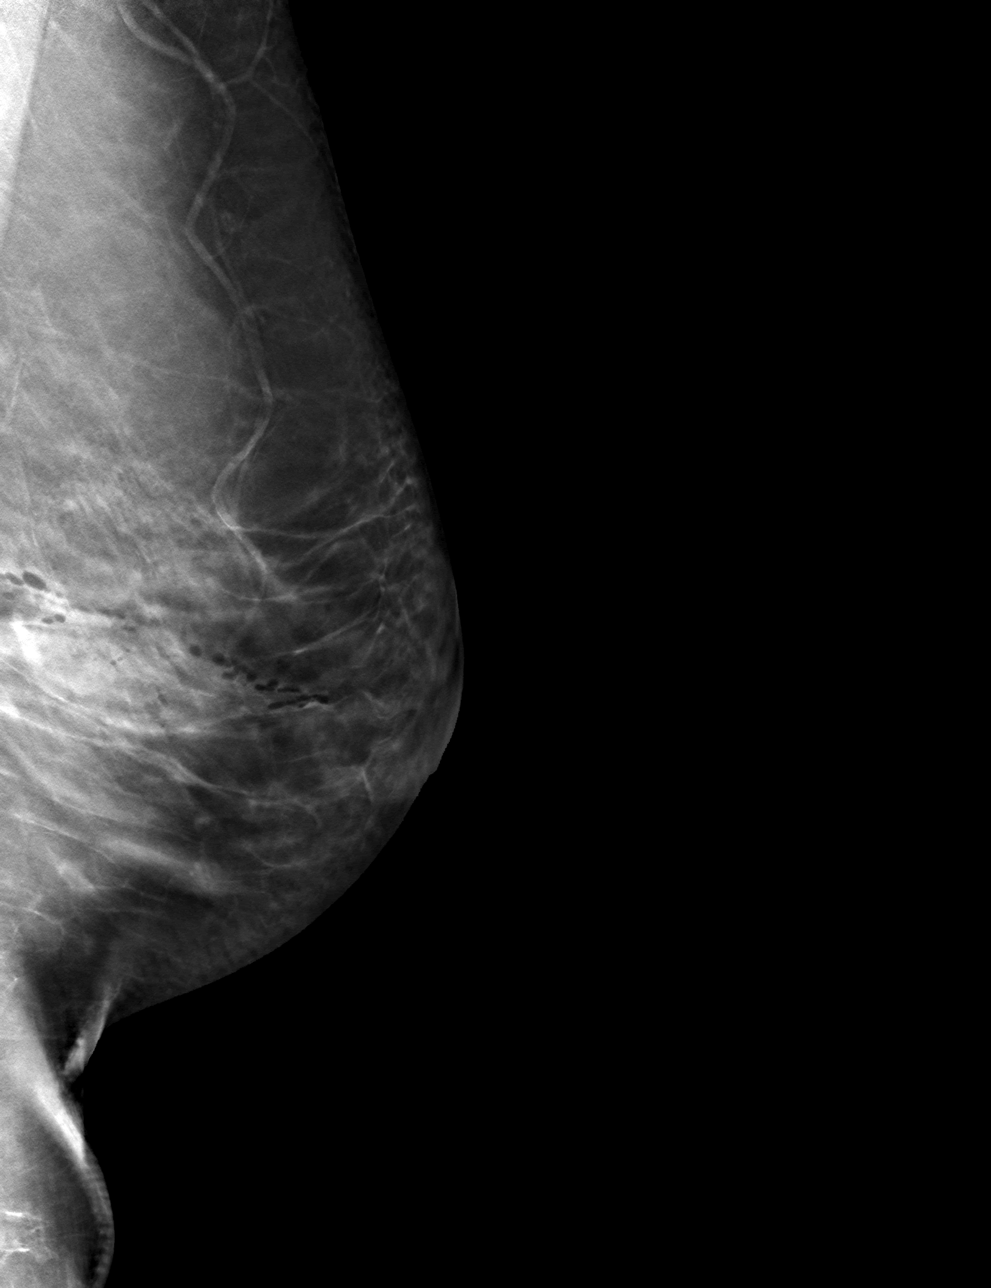

[4 of 12 positions shown; findings below may reference images not displayed]

FINDINGS: Mammographic images were obtained following stereotactic guided
biopsy of left breast asymmetry. The X shaped marking clip is only
visualized on the mL view however does appear appropriately located.
IMPRESSION: Appropriate positioning of the X shaped biopsy marking clip at the
site of biopsy in the posterior central left breast.

Final Assessment: Post Procedure Mammograms for Marker Placement

## 2021-02-18 NOTE — Telephone Encounter (Addendum)
Pt called to inform that her PCP cleared her for procedure on 02/26/21. She saw PCP yesterday, her oxigen levels were 98 so no damage to her lungs. Pt only got bruises but no major injury.

## 2021-02-18 NOTE — Telephone Encounter (Signed)
ATC patient, just to ensure procedure is confirmed. No answer, LMTCB

## 2021-02-20 NOTE — Telephone Encounter (Signed)
ATC patient no answer. LMTCB 

## 2021-02-24 NOTE — Telephone Encounter (Signed)
Left message on patient VM reminding of procedure date and time.

## 2021-02-26 ENCOUNTER — Other Ambulatory Visit: Payer: Self-pay

## 2021-02-26 ENCOUNTER — Encounter: Payer: Self-pay | Admitting: Internal Medicine

## 2021-02-26 ENCOUNTER — Ambulatory Visit (AMBULATORY_SURGERY_CENTER): Payer: 59 | Admitting: Internal Medicine

## 2021-02-26 VITALS — BP 136/73 | HR 67 | Temp 97.8°F | Resp 12 | Ht 66.5 in | Wt 151.0 lb

## 2021-02-26 DIAGNOSIS — K219 Gastro-esophageal reflux disease without esophagitis: Secondary | ICD-10-CM

## 2021-02-26 DIAGNOSIS — R1319 Other dysphagia: Secondary | ICD-10-CM | POA: Diagnosis not present

## 2021-02-26 MED ORDER — SODIUM CHLORIDE 0.9 % IV SOLN: 500.0000 mL | Freq: Once | INTRAVENOUS | Status: DC

## 2021-02-26 NOTE — Patient Instructions (Signed)
Handout on stricture given to you today  YOU HAD AN ENDOSCOPIC PROCEDURE TODAY AT [facility]:   Refer to the procedure report that was given to you for any specific questions about what was found during the examination.  If the procedure report does not answer your questions, please call your gastroenterologist to clarify.  If you requested that your care partner not be given the details of your procedure findings, then the procedure report has been included in a sealed envelope for you to review at your convenience later.  YOU SHOULD EXPECT: Some feelings of bloating in the abdomen. Passage of more gas than usual.  Walking can help get rid of the air that was put into your GI tract during the procedure and reduce the bloating. If you had a lower endoscopy (such as a colonoscopy or flexible sigmoidoscopy) you may notice spotting of blood in your stool or on the toilet paper. If you underwent a bowel prep for your procedure, you may not have a normal bowel movement for a few days.  Please Note:  You might notice some irritation and congestion in your nose or some drainage.  This is from the oxygen used during your procedure.  There is no need for concern and it should clear up in a day or so.  SYMPTOMS TO REPORT IMMEDIATELY:  Following upper endoscopy (EGD)  Vomiting of blood or coffee ground material  New chest pain or pain under the shoulder blades  Painful or persistently difficult swallowing  New shortness of breath  Fever of 100F or higher  Black, tarry-looking stools  For urgent or emergent issues, a gastroenterologist can be reached at any hour by calling [phone]. Do not use MyChart messaging for urgent concerns.    DIET:  We do recommend a small meal at first, but then you may proceed to your regular diet.  Drink plenty of fluids but you should avoid alcoholic beverages for 24 hours.  ACTIVITY:  You should plan to take it easy for the rest of today and you  should NOT DRIVE or use heavy machinery until tomorrow (because of the sedation medicines used during the test).    FOLLOW UP: Our staff will call the number listed on your records 48-72 hours following your procedure to check on you and address any questions or concerns that you may have regarding the information given to you following your procedure. If we do not reach you, we will leave a message.  We will attempt to reach you two times.  During this call, we will ask if you have developed any symptoms of COVID 19. If you develop any symptoms (ie: fever, flu-like symptoms, shortness of breath, cough etc.) before then, please call [phone].  If you test positive for Covid 19 in the 2 weeks post procedure, please call and report this information to [place].    If any biopsies were taken you will be contacted by phone or by letter within the next 1-3 weeks.  Please call us at [phone] if you have not heard about the biopsies in 3 weeks.    SIGNATURES/CONFIDENTIALITY: You and/or your care partner have signed paperwork which will be entered into your electronic medical record.  These signatures attest to the fact that that the information above on your After Visit Summary has been reviewed and is understood.  Full responsibility of the confidentiality of this discharge information lies with you and/or your care-partner.

## 2021-02-26 NOTE — Progress Notes (Signed)
VS-CW 

## 2021-02-26 NOTE — Progress Notes (Signed)
Report to PACU, RN, vss, BBS= Clear.  

## 2021-02-26 NOTE — Progress Notes (Signed)
Called to room to assist during endoscopic procedure.  Patient ID and intended procedure confirmed with present staff. Received instructions for my participation in the procedure from the performing physician.  

## 2021-02-26 NOTE — Op Note (Signed)
[place] Patient Name: Margaret Miles Procedure Date: 02/26/2021 9:39 AM MRN: [id number] Endoscopist: Jerene Bears , MD Age: 65 Referring MD:  Date of Birth: 1955-09-17 Gender: Female Account #: [id number] Procedure:                Upper GI endoscopy Indications:              Dysphagia (pill and solid food), Gastro-esophageal                            reflux disease currently on famotidine 20 mg once                            daily Medicines:                Monitored Anesthesia Care Procedure:                Pre-Anesthesia Assessment:                           - Prior to the procedure, a History and Physical                            was performed, and patient medications and                            allergies were reviewed. The patient's tolerance of                            previous anesthesia was also reviewed. The risks                            and benefits of the procedure and the sedation                            options and risks were discussed with the patient.                            All questions were answered, and informed consent                            was obtained. Prior Anticoagulants: The patient has                            taken no previous anticoagulant or antiplatelet                            agents. ASA Grade Assessment: II - A patient with                            mild systemic disease. After reviewing the risks                            and benefits, the patient was deemed in  satisfactory condition to undergo the procedure.                           After obtaining informed consent, the endoscope was                            passed under direct vision. Throughout the                            procedure, the patient's blood pressure, pulse, and                            oxygen saturations were monitored continuously. The                            GIF [id number] #[id number] was introduced through the                             mouth, and advanced to the second part of duodenum.                            The upper GI endoscopy was accomplished without                            difficulty. The patient tolerated the procedure                            well. Scope In: Scope Out: Findings:                 No endoscopic abnormality was evident in the                            esophagus to explain the patient's complaint of                            dysphagia. It was decided, however, to proceed with                            dilation of the entire esophagus. The scope was                            withdrawn. Dilation was performed with a Maloney                            dilator with mild resistance at 52 Fr.                           Normal mucosa was found in the entire esophagus.                            Z-line is regular at 40 cm. Biopsies were taken  with a cold forceps for histology.                           The gastroesophageal flap valve was visualized                            endoscopically and classified as Hill Grade II                            (fold present, opens with respiration).                           The entire examined stomach was normal.                           The examined duodenum was normal. Complications:            No immediate complications. Estimated Blood Loss:     Estimated blood loss was minimal. Impression:               - No endoscopic esophageal abnormality to explain                            patient's dysphagia. Esophagus dilated with 52 Fr                            Maloney.                           - Normal mucosa was found in the entire esophagus.                            Biopsied.                           - Normal stomach.                           - Normal examined duodenum. Recommendation:           - Patient has a contact number available for                            emergencies. The signs and  symptoms of potential                            delayed complications were discussed with the                            patient. Return to normal activities tomorrow.                            Written discharge instructions were provided to the                            patient.                           -  Resume previous diet.                           - Continue present medications.                           - Await pathology results.                           - If no improvement in dysphagia symptom with                            dilation and esophageal biopsies unrevealing then                            esophageal manometry to evaluate motility would be                            recommended next. Jerene Bears, MD 02/26/2021 10:07:00 AM This report has been signed electronically.

## 2021-03-02 ENCOUNTER — Telehealth: Payer: Self-pay

## 2021-03-02 NOTE — Telephone Encounter (Signed)
Called # [phone] and left a message we tried to reach pt for a follow up call. maw

## 2021-03-02 NOTE — Telephone Encounter (Signed)
  Follow up Call-  Call back number 02/26/2021  Post procedure Call Back phone  # [phone]  Permission to leave phone message Yes  Some recent data might be hidden     Patient questions:  Do you have a fever, pain , or abdominal swelling? No. Pain Score  0 *  Have you tolerated food without any problems? Yes.    Have you been able to return to your normal activities? Yes.    Do you have any questions about your discharge instructions: Diet   No. Medications  No. Follow up visit  No.  Do you have questions or concerns about your Care? No.  Actions: * If pain score is 4 or above: No action needed, pain <4.

## 2021-03-15 ENCOUNTER — Encounter: Payer: Self-pay | Admitting: Internal Medicine

## 2021-10-28 ENCOUNTER — Other Ambulatory Visit: Payer: Self-pay | Admitting: Obstetrics and Gynecology

## 2021-10-28 DIAGNOSIS — Z1231 Encounter for screening mammogram for malignant neoplasm of breast: Secondary | ICD-10-CM

## 2021-11-03 ENCOUNTER — Ambulatory Visit
Admission: RE | Admit: 2021-11-03 | Discharge: 2021-11-03 | Disposition: A | Discharge status: Home / Self Care | Payer: 59 | Source: Ambulatory Visit | Attending: Obstetrics and Gynecology | Admitting: Obstetrics and Gynecology

## 2021-11-03 DIAGNOSIS — Z1231 Encounter for screening mammogram for malignant neoplasm of breast: Secondary | ICD-10-CM

## 2022-06-24 ENCOUNTER — Encounter: Payer: Self-pay | Admitting: Internal Medicine

## 2022-07-29 ENCOUNTER — Telehealth: Payer: Self-pay | Admitting: Internal Medicine

## 2022-07-29 ENCOUNTER — Other Ambulatory Visit: Payer: Self-pay

## 2022-07-29 ENCOUNTER — Other Ambulatory Visit: Payer: Self-pay | Admitting: Internal Medicine

## 2022-07-29 MED ORDER — HYOSCYAMINE SULFATE SL 0.125 MG SL SUBL: SUBLINGUAL_TABLET | SUBLINGUAL | 3 refills | Status: AC

## 2022-07-29 NOTE — Telephone Encounter (Signed)
Inbound call from patient requesting a refill for Levsin. Patient is currently scheduled for procedure. Please advise.

## 2022-07-29 NOTE — Telephone Encounter (Signed)
Sent refill of Levsin to patients pharmacy.

## 2022-08-25 ENCOUNTER — Ambulatory Visit (AMBULATORY_SURGERY_CENTER): Payer: 59

## 2022-08-25 VITALS — Ht 66.0 in | Wt 138.0 lb

## 2022-08-25 DIAGNOSIS — Z1211 Encounter for screening for malignant neoplasm of colon: Secondary | ICD-10-CM

## 2022-08-25 MED ORDER — NA SULFATE-K SULFATE-MG SULF 17.5-3.13-1.6 GM/177ML PO SOLN: 1.0000 | Freq: Once | ORAL | 0 refills | Status: AC

## 2022-08-25 MED ORDER — NA SULFATE-K SULFATE-MG SULF 17.5-3.13-1.6 GM/177ML PO SOLN: 1.0000 | Freq: Once | ORAL | 0 refills | Status: DC

## 2022-08-25 NOTE — Progress Notes (Signed)
No egg or soy allergy known to patient. 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   No issues known to pt with past sedation with any surgeries or procedures Patient denies ever being told they had issues or difficulty with intubation  No FH of Malignant Hyperthermia Pt is not on diet pills Pt is not on  home 02  Pt is not on blood thinners  Pt denies issues with constipation  No A fib or A flutter Have any cardiac testing pending--no Pt instructed to use [employer] or GoodRx for a price reduction on prep  Patient's chart reviewed by Osvaldo Angst CNRA prior to previsit and patient appropriate for the [place].  Previsit completed and red dot placed by patient's name on their procedure day (on provider's schedule).

## 2022-08-30 ENCOUNTER — Telehealth: Payer: Self-pay | Admitting: Internal Medicine

## 2022-08-30 NOTE — Telephone Encounter (Signed)
Message left with call back # to return call.

## 2022-08-30 NOTE — Telephone Encounter (Signed)
Patient called, stated she takes Gabapentin. However she states she cannot take the pill with water, she takes it with apple sauce squeezables. She is calling to see if she can take the capsule with the apple sauce or if she would have to take it with something else. She also states she has not gotten her prep paperwork in the mail. Please advise.

## 2022-09-02 ENCOUNTER — Encounter: Payer: Self-pay | Admitting: Internal Medicine

## 2022-09-05 ENCOUNTER — Encounter: Payer: Self-pay | Admitting: Certified Registered Nurse Anesthetist

## 2022-09-06 ENCOUNTER — Telehealth: Payer: Self-pay | Admitting: Internal Medicine

## 2022-09-06 NOTE — Telephone Encounter (Signed)
Patient called in with concerns stating she has been using the bathroom very frequently, even throughout the night with incontinence. I advised her that this is normal considering she has a 2 day prep & it is meant to clean her colon out thoroughly. Recommended that she utilize depends or older clothing if possible. Pt verbalized all understanding & will try to do the best she can with the prep.

## 2022-09-06 NOTE — Telephone Encounter (Signed)
Patient has a colonoscopy tomorrow and the prep medication has her with runny stools. She is very concerned. Please advise

## 2022-09-07 ENCOUNTER — Encounter: Payer: Self-pay | Admitting: Internal Medicine

## 2022-09-07 ENCOUNTER — Ambulatory Visit (AMBULATORY_SURGERY_CENTER): Payer: 59 | Admitting: Internal Medicine

## 2022-09-07 VITALS — BP 110/74 | HR 65 | Temp 97.8°F | Resp 11 | Ht 66.0 in | Wt 136.0 lb

## 2022-09-07 DIAGNOSIS — Z1211 Encounter for screening for malignant neoplasm of colon: Secondary | ICD-10-CM | POA: Diagnosis present

## 2022-09-07 DIAGNOSIS — D122 Benign neoplasm of ascending colon: Secondary | ICD-10-CM

## 2022-09-07 DIAGNOSIS — D125 Benign neoplasm of sigmoid colon: Secondary | ICD-10-CM

## 2022-09-07 MED ORDER — SODIUM CHLORIDE 0.9 % IV SOLN: 500.0000 mL | Freq: Once | INTRAVENOUS | Status: DC

## 2022-09-07 NOTE — Progress Notes (Signed)
GASTROENTEROLOGY PROCEDURE H&P NOTE   Primary Care Physician: Pa, [place]    Reason for Procedure:  Colon cancer screening  Plan:    Colonoscopy  Patient is appropriate for endoscopic procedure(s) in the ambulatory ([facility]) setting.  The nature of the procedure, as well as the risks, benefits, and alternatives were carefully and thoroughly reviewed with the patient. Ample time for discussion and questions allowed. The patient understood, was satisfied, and agreed to proceed.     HPI: Margaret Miles is a 67 y.o. female who presents for screening colonoscopy.  Medical history as below.  Tolerated the prep.  No recent chest pain or shortness of breath.  No abdominal pain today.  Past Medical History:  Diagnosis Date   Allergic rhinitis    Anxiety    Arthritis    GERD (gastroesophageal reflux disease)    Glioblastoma multiforme of brain (HCC)    Graves disease    H/O hiatal hernia    Hypothyroidism    Insomnia    Interstitial cystitis    Irritable bowel syndrome with constipation    Lactose intolerance    Small intestinal bacterial overgrowth     Past Surgical History:  Procedure Laterality Date   BRAIN SURGERY     BREAST BIOPSY Left 01/25/2020   COLONOSCOPY WITH PROPOFOL  06/23/2012   Procedure: COLONOSCOPY WITH PROPOFOL;  Surgeon: Garlan Fair, MD;  Location: [facility];  Service: Endoscopy;  Laterality: N/A;    Prior to Admission medications   Medication Sig Start Date End Date Taking? Authorizing Provider  clonazePAM (KLONOPIN) 1 MG tablet Take 1 mg by mouth daily.   Yes [provider]  famotidine (PEPCID) 20 MG tablet Take 20 mg by mouth daily.   Yes [provider]  fluticasone Asencion Islam) 50 MCG/ACT nasal spray  05/24/11  Yes [provider]  gabapentin (NEURONTIN) 100 MG capsule Take 200 mg by mouth at bedtime.   Yes [provider]  levothyroxine (SYNTHROID) 100 MCG tablet Take by mouth.  Take 1 tablet 30 mins to 1 hour before breakfast Tues, Thurs, Sat, Sun   Yes [provider]  levothyroxine (SYNTHROID) 88 MCG tablet Take 88 mcg by mouth daily before breakfast. On Monday, Wednesday and Friday   Yes [provider]  Melatonin 1 MG CAPS Take by mouth.   Yes [provider]  Multiple Vitamin tablet With herbal supplements   Yes [provider]  Probiotic Product (ALIGN) 4 MG CAPS Take 1 capsule by mouth daily.   Yes [provider]  Alpha-D-Galactosidase (BEANO PO) Take by mouth as needed.    [provider]  Calcium Glycerophosphate (PRELIEF) 340 (65-50) MG (CA-P) TABS TAKE TWO TABLETS BY MOUTH BEFORE COFFEE, PULLS ACID FROM COFFEE. 10/12/19   [provider]  Cholecalciferol 50 MCG (2000 UT) TABS Take by mouth.    [provider]  Hyoscyamine Sulfate SL (LEVSIN/SL) 0.125 MG SUBL Take 1-2 tablets by mouth every 6 hours as needed Patient not taking: Reported on 08/25/2022 07/29/22   Shayona Hibbitts, Lajuan Lines, MD  ketoconazole (NIZORAL) 2 % cream Apply topically 2 (two) times daily as needed. Patient not taking: Reported on 08/25/2022 10/21/20   [provider]  Lactase (LACTAID PO) Take by mouth daily.    [provider]  Propylene Glycol 0.6 % SOLN Systane Balance 0.6 % eye drops    [provider]    Current Outpatient Medications  Medication Sig Dispense Refill   clonazePAM (  KLONOPIN) 1 MG tablet Take 1 mg by mouth daily.     famotidine (PEPCID) 20 MG tablet Take 20 mg by mouth daily.     fluticasone (FLONASE) 50 MCG/ACT nasal spray      gabapentin (NEURONTIN) 100 MG capsule Take 200 mg by mouth at bedtime.     levothyroxine (SYNTHROID) 100 MCG tablet Take by mouth. Take 1 tablet 30 mins to 1 hour before breakfast Tues, Thurs, Sat, Sun     levothyroxine (SYNTHROID) 88 MCG tablet Take 88 mcg by mouth daily before breakfast. On Monday, Wednesday and Friday     Melatonin 1 MG CAPS Take by mouth.      Multiple Vitamin tablet With herbal supplements     Probiotic Product (ALIGN) 4 MG CAPS Take 1 capsule by mouth daily.     Alpha-D-Galactosidase (BEANO PO) Take by mouth as needed.     Calcium Glycerophosphate (PRELIEF) 340 (65-50) MG (CA-P) TABS TAKE TWO TABLETS BY MOUTH BEFORE COFFEE, PULLS ACID FROM COFFEE.     Cholecalciferol 50 MCG (2000 UT) TABS Take by mouth.     Hyoscyamine Sulfate SL (LEVSIN/SL) 0.125 MG SUBL Take 1-2 tablets by mouth every 6 hours as needed (Patient not taking: Reported on 08/25/2022) 90 tablet 3   ketoconazole (NIZORAL) 2 % cream Apply topically 2 (two) times daily as needed. (Patient not taking: Reported on 08/25/2022)     Lactase (LACTAID PO) Take by mouth daily.     Propylene Glycol 0.6 % SOLN Systane Balance 0.6 % eye drops     Current Facility-Administered Medications  Medication Dose Route Frequency Provider Last Rate Last Admin   0.9 %  sodium chloride infusion  500 mL Intravenous Once Caydn Justen, Lajuan Lines, MD        Allergies as of 09/07/2022 - Review Complete 09/07/2022  Allergen Reaction Noted   Erythromycin Nausea Only 06/23/2012   Tessalon [benzonatate] Other (See Comments) 06/23/2012   Codeine Anxiety 06/23/2012   Sulfa antibiotics Rash 06/23/2012   Sulfasalazine Rash 06/23/2012   Tape Rash 06/10/2020   Voltaren [diclofenac sodium] Rash 06/23/2012    Family History  Problem Relation Age of Onset   ALS Mother    Diabetes Father    Hypertension Father    Hypothyroidism Sister    Breast cancer Maternal Grandmother    Colon cancer Neg Hx    Esophageal cancer Neg Hx    Rectal cancer Neg Hx    Stomach cancer Neg Hx     Social History   Socioeconomic History   Marital status: Married    Spouse name: Not on file   Number of children: Not on file   Years of education: Not on file   Highest education level: Not on file  Occupational History   Not on file  Tobacco Use   Smoking status: Never   Smokeless tobacco: Never  Vaping Use   Vaping  Use: Never used  Substance and Sexual Activity   Alcohol use: Not Currently   Drug use: Not Currently   Sexual activity: Not on file  Other Topics Concern   Not on file  Social History Narrative   Not on file   Social Determinants of Health   Financial Resource Strain: Not on file  Food Insecurity: Not on file  Transportation Needs: Not on file  Physical Activity: Not on file  Stress: Not on file  Social Connections: Not on file  Intimate Partner Violence: Not on file    Physical Exam: Vital signs  in last 24 hours: '@BP'$  (!) 143/80   Pulse 75   Temp 97.8 F (36.6 C)   Ht '5\' 6"'$  (1.676 m)   Wt 136 lb (61.7 kg)   SpO2 99%   BMI 21.95 kg/m  GEN: NAD EYE: Sclerae anicteric ENT: MMM CV: Non-tachycardic Pulm: CTA b/l GI: Soft, NT/ND NEURO:  Alert & Oriented x 3   Zenovia Jarred, MD [facility]  09/07/2022 10:41 AM

## 2022-09-07 NOTE — Op Note (Signed)
[facility] Patient Name: Margaret Miles Procedure Date: 09/07/2022 10:39 AM MRN: [id number] Endoscopist: Jerene Bears , MD, [id number] Age: 67 Referring MD:  Date of Birth: 10-13-1955 Gender: Female Account #: [id number] Procedure:                Colonoscopy Indications:              Screening for colorectal malignant neoplasm, Last                            colonoscopy 10 years ago Medicines:                Monitored Anesthesia Care Procedure:                Pre-Anesthesia Assessment:                           - Prior to the procedure, a History and Physical                            was performed, and patient medications and                            allergies were reviewed. The patient's tolerance of                            previous anesthesia was also reviewed. The risks                            and benefits of the procedure and the sedation                            options and risks were discussed with the patient.                            All questions were answered, and informed consent                            was obtained. Prior Anticoagulants: The patient has                            taken no anticoagulant or antiplatelet agents. ASA                            Grade Assessment: III - A patient with severe                            systemic disease. After reviewing the risks and                            benefits, the patient was deemed in satisfactory                            condition to undergo the procedure.  After obtaining informed consent, the colonoscope                            was passed under direct vision. Throughout the                            procedure, the patient's blood pressure, pulse, and                            oxygen saturations were monitored continuously. The                            Olympus Scope [id number] was introduced through the                            anus and advanced to the  cecum, identified by                            appendiceal orifice and ileocecal valve. The                            colonoscopy was technically difficult and complex                            due to significant looping and a tortuous colon.                            The patient tolerated the procedure well. The                            quality of the bowel preparation was fair requiring                            copious irrigation and lavage. The ileocecal valve,                            appendiceal orifice, and rectum were photographed. Scope In: 10:51:34 AM Scope Out: 02:40:97 AM Scope Withdrawal Time: 0 hours 15 minutes 52 seconds  Total Procedure Duration: 0 hours 25 minutes 25 seconds  Findings:                 The digital rectal exam was normal.                           A 6 mm polyp was found in the ascending colon. The                            polyp was sessile. The polyp was removed with a                            cold snare. Resection and retrieval were complete.                           A 2  mm polyp was found in the ascending colon. The                            polyp was sessile. The polyp was removed with a                            cold biopsy forceps. Resection and retrieval were                            complete.                           A 4 mm polyp was found in the sigmoid colon. The                            polyp was sessile. The polyp was removed with a                            cold snare. Resection and retrieval were complete.                           External and internal hemorrhoids were found during                            retroflexion. The hemorrhoids were small. Complications:            No immediate complications. Estimated Blood Loss:     Estimated blood loss was minimal. Impression:               - Preparation of the colon was fair.                           - One 6 mm polyp in the ascending colon, removed                             with a cold snare. Resected and retrieved.                           - One 2 mm polyp in the ascending colon, removed                            with a cold biopsy forceps. Resected and retrieved.                           - One 4 mm polyp in the sigmoid colon, removed with                            a cold snare. Resected and retrieved.                           - External and internal hemorrhoids. Recommendation:           - Patient has a contact number available for  emergencies. The signs and symptoms of potential                            delayed complications were discussed with the                            patient. Return to normal activities tomorrow.                            Written discharge instructions were provided to the                            patient.                           - Resume previous diet.                           - Continue present medications.                           - Await pathology results.                           - Repeat colonoscopy is recommended. The                            colonoscopy date will be determined after pathology                            results from today's exam become available for                            review. Jerene Bears, MD 09/07/2022 11:20:04 AM This report has been signed electronically.

## 2022-09-07 NOTE — Progress Notes (Signed)
Report given to PACU, vss 

## 2022-09-07 NOTE — Patient Instructions (Addendum)
Resume previous diet. Continue present medications. Await pathology results. Repeat colonoscopy is recommended. The colonoscopy date will be determined after pathology results from today's exam become available for  review.  YOU HAD AN ENDOSCOPIC PROCEDURE TODAY AT [facility]:   Refer to the procedure report that was given to you for any specific questions about what was found during the examination.  If the procedure report does not answer your questions, please call your gastroenterologist to clarify.  If you requested that your care partner not be given the details of your procedure findings, then the procedure report has been included in a sealed envelope for you to review at your convenience later.  YOU SHOULD EXPECT: Some feelings of bloating in the abdomen. Passage of more gas than usual.  Walking can help get rid of the air that was put into your GI tract during the procedure and reduce the bloating. If you had a lower endoscopy (such as a colonoscopy or flexible sigmoidoscopy) you may notice spotting of blood in your stool or on the toilet paper. If you underwent a bowel prep for your procedure, you may not have a normal bowel movement for a few days.  Please Note:  You might notice some irritation and congestion in your nose or some drainage.  This is from the oxygen used during your procedure.  There is no need for concern and it should clear up in a day or so.  SYMPTOMS TO REPORT IMMEDIATELY:  Following lower endoscopy (colonoscopy or flexible sigmoidoscopy):  Excessive amounts of blood in the stool  Significant tenderness or worsening of abdominal pains  Swelling of the abdomen that is new, acute  Fever of 100F or higher  For urgent or emergent issues, a gastroenterologist can be reached at any hour by calling [phone]. Do not use MyChart messaging for urgent concerns.    DIET:  We do recommend a small meal at first, but then you may proceed to your  regular diet.  Drink plenty of fluids but you should avoid alcoholic beverages for 24 hours.  ACTIVITY:  You should plan to take it easy for the rest of today and you should NOT DRIVE or use heavy machinery until tomorrow (because of the sedation medicines used during the test).    FOLLOW UP: Our staff will call the number listed on your records the next business day following your procedure.  We will call around 7:15- 8:00 am to check on you and address any questions or concerns that you may have regarding the information given to you following your procedure. If we do not reach you, we will leave a message.     If any biopsies were taken you will be contacted by phone or by letter within the next 1-3 weeks.  Please call us at [phone] if you have not heard about the biopsies in 3 weeks.    SIGNATURES/CONFIDENTIALITY: You and/or your care partner have signed paperwork which will be entered into your electronic medical record.  These signatures attest to the fact that that the information above on your After Visit Summary has been reviewed and is understood.  Full responsibility of the confidentiality of this discharge information lies with you and/or your care-partner.

## 2022-09-07 NOTE — Progress Notes (Signed)
Pt's states no medical or surgical changes since previsit or office visit. 

## 2022-09-07 NOTE — Progress Notes (Signed)
Called to room to assist during endoscopic procedure.  Patient ID and intended procedure confirmed with present staff. Received instructions for my participation in the procedure from the performing physician.  

## 2022-09-08 ENCOUNTER — Telehealth: Payer: Self-pay

## 2022-09-08 NOTE — Telephone Encounter (Signed)
  Follow up Call-     09/07/2022   10:33 AM 02/26/2021    9:19 AM  Call back number  Post procedure Call Back phone  # [phone]  Permission to leave phone message Yes Yes     Follow up call made.  NALM

## 2022-09-10 ENCOUNTER — Encounter: Payer: Self-pay | Admitting: Internal Medicine

## 2022-09-20 ENCOUNTER — Other Ambulatory Visit: Payer: Self-pay

## 2022-09-20 MED ORDER — HYDROCORTISONE ACETATE 25 MG RE SUPP: 25.0000 mg | Freq: Two times a day (BID) | RECTAL | 0 refills | Status: DC

## 2022-09-20 NOTE — Telephone Encounter (Signed)
Hydrocortisone suppository 25 mg nightly, can be used twice daily if needed x 3 to 5 days Symptomatic hemorrhoids

## 2022-10-04 ENCOUNTER — Other Ambulatory Visit: Payer: Self-pay | Admitting: Obstetrics and Gynecology

## 2022-10-04 DIAGNOSIS — Z1231 Encounter for screening mammogram for malignant neoplasm of breast: Secondary | ICD-10-CM

## 2022-11-18 ENCOUNTER — Ambulatory Visit
Admission: RE | Admit: 2022-11-18 | Discharge: 2022-11-18 | Disposition: A | Discharge status: Home / Self Care | Payer: 59 | Source: Ambulatory Visit | Attending: Obstetrics and Gynecology | Admitting: Obstetrics and Gynecology

## 2022-11-18 DIAGNOSIS — Z1231 Encounter for screening mammogram for malignant neoplasm of breast: Secondary | ICD-10-CM

## 2023-10-20 ENCOUNTER — Other Ambulatory Visit: Payer: Self-pay | Admitting: Obstetrics and Gynecology

## 2023-10-20 DIAGNOSIS — Z1231 Encounter for screening mammogram for malignant neoplasm of breast: Secondary | ICD-10-CM

## 2023-10-26 DIAGNOSIS — H6041 Cholesteatoma of right external ear: Secondary | ICD-10-CM | POA: Diagnosis not present

## 2023-11-23 ENCOUNTER — Ambulatory Visit: Payer: 59

## 2023-11-25 DIAGNOSIS — R0981 Nasal congestion: Secondary | ICD-10-CM | POA: Diagnosis not present

## 2023-11-25 DIAGNOSIS — U071 COVID-19: Secondary | ICD-10-CM | POA: Diagnosis not present

## 2023-11-25 DIAGNOSIS — Z8639 Personal history of other endocrine, nutritional and metabolic disease: Secondary | ICD-10-CM | POA: Diagnosis not present

## 2023-12-07 ENCOUNTER — Ambulatory Visit
Admission: RE | Admit: 2023-12-07 | Discharge: 2023-12-07 | Disposition: A | Discharge status: Home / Self Care | Source: Ambulatory Visit | Attending: Obstetrics and Gynecology | Admitting: Obstetrics and Gynecology

## 2023-12-07 DIAGNOSIS — Z1231 Encounter for screening mammogram for malignant neoplasm of breast: Secondary | ICD-10-CM

## 2023-12-21 DIAGNOSIS — E7800 Pure hypercholesterolemia, unspecified: Secondary | ICD-10-CM | POA: Diagnosis not present

## 2023-12-21 DIAGNOSIS — E890 Postprocedural hypothyroidism: Secondary | ICD-10-CM | POA: Diagnosis not present

## 2023-12-21 DIAGNOSIS — K589 Irritable bowel syndrome without diarrhea: Secondary | ICD-10-CM | POA: Diagnosis not present

## 2023-12-21 DIAGNOSIS — Z0000 Encounter for general adult medical examination without abnormal findings: Secondary | ICD-10-CM | POA: Diagnosis not present

## 2023-12-21 DIAGNOSIS — M8589 Other specified disorders of bone density and structure, multiple sites: Secondary | ICD-10-CM | POA: Diagnosis not present

## 2023-12-21 DIAGNOSIS — K219 Gastro-esophageal reflux disease without esophagitis: Secondary | ICD-10-CM | POA: Diagnosis not present

## 2023-12-21 DIAGNOSIS — Z23 Encounter for immunization: Secondary | ICD-10-CM | POA: Diagnosis not present

## 2023-12-22 ENCOUNTER — Other Ambulatory Visit (HOSPITAL_COMMUNITY): Payer: Self-pay | Admitting: Internal Medicine

## 2023-12-22 DIAGNOSIS — E7800 Pure hypercholesterolemia, unspecified: Secondary | ICD-10-CM

## 2023-12-27 DIAGNOSIS — M2141 Flat foot [pes planus] (acquired), right foot: Secondary | ICD-10-CM | POA: Diagnosis not present

## 2023-12-27 DIAGNOSIS — M2142 Flat foot [pes planus] (acquired), left foot: Secondary | ICD-10-CM | POA: Diagnosis not present

## 2023-12-27 DIAGNOSIS — L603 Nail dystrophy: Secondary | ICD-10-CM | POA: Diagnosis not present

## 2023-12-27 DIAGNOSIS — S90211A Contusion of right great toe with damage to nail, initial encounter: Secondary | ICD-10-CM | POA: Diagnosis not present

## 2024-01-26 DIAGNOSIS — H8112 Benign paroxysmal vertigo, left ear: Secondary | ICD-10-CM | POA: Diagnosis not present

## 2024-02-21 ENCOUNTER — Ambulatory Visit (HOSPITAL_COMMUNITY)
Admission: RE | Admit: 2024-02-21 | Discharge: 2024-02-21 | Disposition: A | Discharge status: Home / Self Care | Payer: Self-pay | Source: Ambulatory Visit | Attending: Internal Medicine | Admitting: Internal Medicine

## 2024-02-21 DIAGNOSIS — E7800 Pure hypercholesterolemia, unspecified: Secondary | ICD-10-CM | POA: Insufficient documentation

## 2024-02-27 DIAGNOSIS — M816 Localized osteoporosis [Lequesne]: Secondary | ICD-10-CM | POA: Diagnosis not present

## 2024-02-28 DIAGNOSIS — H25813 Combined forms of age-related cataract, bilateral: Secondary | ICD-10-CM | POA: Diagnosis not present

## 2024-02-28 DIAGNOSIS — H16223 Keratoconjunctivitis sicca, not specified as Sjogren's, bilateral: Secondary | ICD-10-CM | POA: Diagnosis not present

## 2024-02-28 DIAGNOSIS — H40013 Open angle with borderline findings, low risk, bilateral: Secondary | ICD-10-CM | POA: Diagnosis not present

## 2024-02-28 DIAGNOSIS — H53462 Homonymous bilateral field defects, left side: Secondary | ICD-10-CM | POA: Diagnosis not present

## 2024-02-28 DIAGNOSIS — H526 Other disorders of refraction: Secondary | ICD-10-CM | POA: Diagnosis not present

## 2024-03-12 DIAGNOSIS — H6121 Impacted cerumen, right ear: Secondary | ICD-10-CM | POA: Diagnosis not present

## 2024-04-05 DIAGNOSIS — S91201A Unspecified open wound of right great toe with damage to nail, initial encounter: Secondary | ICD-10-CM | POA: Diagnosis not present

## 2024-04-24 DIAGNOSIS — D2261 Melanocytic nevi of right upper limb, including shoulder: Secondary | ICD-10-CM | POA: Diagnosis not present

## 2024-04-24 DIAGNOSIS — L738 Other specified follicular disorders: Secondary | ICD-10-CM | POA: Diagnosis not present

## 2024-04-24 DIAGNOSIS — D225 Melanocytic nevi of trunk: Secondary | ICD-10-CM | POA: Diagnosis not present

## 2024-04-24 DIAGNOSIS — L821 Other seborrheic keratosis: Secondary | ICD-10-CM | POA: Diagnosis not present

## 2024-05-10 DIAGNOSIS — H8112 Benign paroxysmal vertigo, left ear: Secondary | ICD-10-CM | POA: Diagnosis not present

## 2024-05-18 DIAGNOSIS — R110 Nausea: Secondary | ICD-10-CM | POA: Diagnosis not present

## 2024-05-18 DIAGNOSIS — R0989 Other specified symptoms and signs involving the circulatory and respiratory systems: Secondary | ICD-10-CM | POA: Diagnosis not present

## 2024-05-18 DIAGNOSIS — F411 Generalized anxiety disorder: Secondary | ICD-10-CM | POA: Diagnosis not present

## 2024-05-18 DIAGNOSIS — R000 Tachycardia, unspecified: Secondary | ICD-10-CM | POA: Diagnosis not present

## 2024-06-11 DIAGNOSIS — E890 Postprocedural hypothyroidism: Secondary | ICD-10-CM | POA: Diagnosis not present

## 2024-06-11 DIAGNOSIS — R110 Nausea: Secondary | ICD-10-CM | POA: Diagnosis not present

## 2024-06-12 ENCOUNTER — Telehealth: Payer: Self-pay | Admitting: Internal Medicine

## 2024-06-12 DIAGNOSIS — L603 Nail dystrophy: Secondary | ICD-10-CM | POA: Diagnosis not present

## 2024-06-12 DIAGNOSIS — B351 Tinea unguium: Secondary | ICD-10-CM | POA: Diagnosis not present

## 2024-06-12 NOTE — Telephone Encounter (Signed)
 Inbound call from patient stating she has been experiencing nausea since June of this year.she states that she seen her PCP yesterday and her PCP wants her to start  omeprazole. She states that she looked up the side effects and noticed it says nausea is a common side effect. Patient states she thinks her syptoms may be coming from her hernia that she's had since she was 68 years old. Patient is requesting a call back from the nurse to discuss recommendations and next steps. Please advise.   Nausea and believes its coming from her hernia.

## 2024-06-12 NOTE — Telephone Encounter (Signed)
 Pt states she saw her PCP yesterday, she has been having nausea after she eats. PCP wants her to start on omeprazole but she is hesitant to start this because she looked a side effect and nausea was listed. After she eats she has nausea and with the nausea her heart rate increases. PCP checked out her thyroid  and that was ok. She has been sucking on an altoid when this happens and it helps, she also takes pepcid otc. She has not vomited just feels nauseated. Pt also states she has a hiatal hernia and wonders if this could be causing he symptoms. Pt scheduled to see Dr. Albertus 06/22/24 at 9:30am. Pt aware of appt.

## 2024-06-18 DIAGNOSIS — C711 Malignant neoplasm of frontal lobe: Secondary | ICD-10-CM | POA: Diagnosis not present

## 2024-06-21 DIAGNOSIS — R399 Unspecified symptoms and signs involving the genitourinary system: Secondary | ICD-10-CM | POA: Diagnosis not present

## 2024-06-21 LAB — LAB REPORT - SCANNED: EGFR: 65

## 2024-06-22 ENCOUNTER — Encounter: Payer: Self-pay | Admitting: Internal Medicine

## 2024-06-22 ENCOUNTER — Ambulatory Visit: Admitting: Internal Medicine

## 2024-06-22 VITALS — BP 140/80 | HR 72 | Ht 66.0 in | Wt 135.2 lb

## 2024-06-22 DIAGNOSIS — Q438 Other specified congenital malformations of intestine: Secondary | ICD-10-CM | POA: Diagnosis not present

## 2024-06-22 DIAGNOSIS — R110 Nausea: Secondary | ICD-10-CM

## 2024-06-22 DIAGNOSIS — Z8719 Personal history of other diseases of the digestive system: Secondary | ICD-10-CM | POA: Diagnosis not present

## 2024-06-22 DIAGNOSIS — K5909 Other constipation: Secondary | ICD-10-CM

## 2024-06-22 NOTE — Progress Notes (Signed)
 Subjective:    Patient ID: Margaret Miles, female    DOB: 1956/02/18, 68 y.o.   MRN: [id number]  HPI Margaret Miles is a 68 year old female with GERD and probable hiatal hernia who presents with nausea and chronic constipation.  She experiences sudden episodes of nausea, often accompanied by an increased heart rate. The nausea is more prevalent in the evening and can occur both after eating and when she has not eaten. It is described as fleeting, lasting about ten seconds, and is somewhat relieved by putting a mint in her mouth. No vomiting is reported, although she recalls an instance where coffee came up unexpectedly. She has been on omeprazole 40 mg daily since June 18, 2024, with no improvement in symptoms yet. Previously, she was taking famotidine 20 mg once daily, which she increased to twice daily when nausea began.  Her last EGD on February 26, 2021, showed no endoscopic abnormalities to explain her dysphagia. Biopsies were benign. She was previously on famotidine 20 mg daily for GERD.  She experiences chronic constipation, particularly on Sundays, which she attributes to disruptions in her routine. She manages her constipation with coffee, water, and grape juice. A redundant colon was noted during her last colonoscopy on September 07, 2022. Constipation is not a daily issue but is exacerbated by changes in her schedule.  Her past medical history includes IBS, chronic right lower quadrant abdominal pain, SIBO, a remote history of glioblastoma multiforme status post resection, Graves' disease now with hypothyroidism, interstitial cystitis, and insomnia. 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  She takes various supplements, including digestive enzymes and probiotics, which she believes help with her bowel movements. She also takes gabapentin at night. Her mother had a history of severe constipation.  No vomiting and heartburn. Reports  nausea, increased heart rate during episodes, and occasional reflux symptoms. No current use of iron supplements.   Review of Systems As per HPI otherwise negative  Current Medications, Allergies, Past Medical History, Past Surgical History, Family History and Social History were reviewed in [employer] record.    Objective:   Physical Exam BP (!) 140/80   Pulse 72   Ht 5' 6 (1.676 m)   Wt 135 lb 4 oz (61.3 kg)   BMI 21.83 kg/m  Gen: awake, alert, NAD HEENT: anicteric  Abd: soft, NT/ND, +BS throughout Ext: no c/c/e Neuro: nonfocal  RADIOLOGY CT cardiac scoring: No acute or significant extracardiac findings, calcium score 0  DIAGNOSTIC EGD: Normal mucosa, esophagus dilated with 52 French millennial dilator, Hill grade II gastroesophageal flap, normal stomach and duodenum (02/26/2021) Colonoscopy: Three polyps <1 cm, tortuous colon, small hemorrhoids (09/07/2022)  PATHOLOGY Esophageal biopsy: Benign, no evidence of esophageal squamous neoplasia (ESN), no inflammation (02/26/2021) Polyp pathology: Tubular adenoma x3 (09/07/2022)      Assessment & Plan:   Nausea with gastroesophageal reflux disease and probable hiatal hernia Intermittent nausea, primarily nocturnal and postprandial. No improvement with omeprazole thus far (x 5 days). Possible gastritis noted on endoscopy but bx benign. Differential includes GERD, dyspepsia, and gallbladder issues. Discussed possibility of rebound acid hypersecretion risk with omeprazole discontinuation. - Continue omeprazole 40 mg daily for two weeks and reassess efficacy. - Order abdominal ultrasound to evaluate gallbladder for possible gallstones. - Order H. pylori stool antigen test to rule out bacterial gastritis. - Review and discontinue unnecessary supplements, particularly digestive enzymes and excess probiotics. - Reassess symptoms after two weeks of omeprazole  treatment.  Chronic constipation with  redundant/tortuous colon and irritable bowel syndrome Chronic constipation, possibly related to redundant colon anatomy. Managed with dietary adjustments and supplements. Anatomical challenges of a tortuous colon impact bowel movements. - Continue current dietary management strategies, including hydration and fiber intake. - Consider journaling bowel habits to identify patterns related to constipation and nausea.  Tubular adenomas of colon (colonic polyps) Tubular adenomas identified during last colonoscopy. Redundant colon complicates colonoscopy procedures. Regular surveillance colonoscopies necessary due to precancerous polyps. - Plan for surveillance colonoscopy in five years unless symptoms or findings warrant earlier intervention.  40 minutes total spent today including patient facing time, coordination of care, reviewing medical history/procedures/pertinent radiology studies, and documentation of the encounter.

## 2024-06-22 NOTE — Patient Instructions (Signed)
 You have been scheduled for an abdominal ultrasound at [facility] (1st floor of hospital) on 06/29/2024 at 8:00am. Please arrive 15 minutes prior to your appointment for registration. Make certain not to have anything to eat or drink after midnight prior to your appointment. Should you need to reschedule your appointment, please contact radiology at [phone]. This test typically takes about 30 minutes to perform.  Continue omeprazole for 2 more weeks and then mychart message us  to let us  know how you are doing.   Discontinue digestive supplements as discussed with Dr. Albertus.  Your provider has ordered [employer] stool testing for you. You have received a kit from our office today containing all necessary supplies to complete this test. Please carefully read the stool collection instructions provided in the kit before opening the accompanying materials. In addition, be sure there is a label providing your full name and date of birth on the puritan opti-swab tube that is supplied in the kit (if you do not see a label with this information on your test tube, please make us  aware before test collection!). After completing the test, you should secure the purtian tube into the specimen biohazard bag. The [facility] E-Req sheet (including date and time of specimen collection) should be placed into the outside pocket of the specimen biohazard bag and returned to the [facility] (basement floor of [facility]) within 3 days of collection. Please make sure to give the specimen to a staff member at the lab. DO NOT leave the specimen on the counter.   If the specimen date and time (can be found in the upper right boxed portion of the sheet) are not filled out on the E-Req sheet, the test will NOT be performed.

## 2024-06-28 DIAGNOSIS — Z8719 Personal history of other diseases of the digestive system: Secondary | ICD-10-CM | POA: Diagnosis not present

## 2024-06-28 DIAGNOSIS — R110 Nausea: Secondary | ICD-10-CM | POA: Diagnosis not present

## 2024-06-29 ENCOUNTER — Ambulatory Visit (HOSPITAL_COMMUNITY)
Admission: RE | Admit: 2024-06-29 | Discharge: 2024-06-29 | Disposition: A | Discharge status: Home / Self Care | Source: Ambulatory Visit | Attending: Internal Medicine | Admitting: Internal Medicine

## 2024-06-29 DIAGNOSIS — R110 Nausea: Secondary | ICD-10-CM | POA: Diagnosis not present

## 2024-06-29 DIAGNOSIS — Z8719 Personal history of other diseases of the digestive system: Secondary | ICD-10-CM | POA: Diagnosis not present

## 2024-06-29 DIAGNOSIS — K5909 Other constipation: Secondary | ICD-10-CM | POA: Diagnosis not present

## 2024-06-30 ENCOUNTER — Ambulatory Visit: Payer: Self-pay | Admitting: Internal Medicine

## 2024-07-03 ENCOUNTER — Other Ambulatory Visit: Payer: Self-pay

## 2024-07-03 DIAGNOSIS — R110 Nausea: Secondary | ICD-10-CM

## 2024-07-03 NOTE — Telephone Encounter (Signed)
 Please let pt know she can come for celiac panel TTG + IGA level Thanks JMP

## 2024-07-04 ENCOUNTER — Other Ambulatory Visit

## 2024-07-04 DIAGNOSIS — R110 Nausea: Secondary | ICD-10-CM

## 2024-07-04 NOTE — Telephone Encounter (Signed)
 Error

## 2024-07-05 DIAGNOSIS — H8111 Benign paroxysmal vertigo, right ear: Secondary | ICD-10-CM | POA: Diagnosis not present

## 2024-07-05 DIAGNOSIS — H6121 Impacted cerumen, right ear: Secondary | ICD-10-CM | POA: Diagnosis not present

## 2024-07-05 DIAGNOSIS — H6041 Cholesteatoma of right external ear: Secondary | ICD-10-CM | POA: Diagnosis not present

## 2024-07-05 LAB — TISSUE TRANSGLUTAMINASE ABS,IGG,IGA
(tTG) Ab, IgA: <1 U/mL
(tTG) Ab, IgG: <1 U/mL

## 2024-07-09 ENCOUNTER — Ambulatory Visit: Payer: Self-pay | Admitting: Internal Medicine

## 2024-07-15 DIAGNOSIS — Z79899 Other long term (current) drug therapy: Secondary | ICD-10-CM | POA: Diagnosis not present

## 2024-07-15 DIAGNOSIS — R110 Nausea: Secondary | ICD-10-CM | POA: Diagnosis not present

## 2024-07-15 DIAGNOSIS — R350 Frequency of micturition: Secondary | ICD-10-CM | POA: Diagnosis not present

## 2024-07-23 ENCOUNTER — Encounter: Payer: Self-pay | Admitting: Internal Medicine

## 2024-07-31 NOTE — Telephone Encounter (Signed)
 I would have her do a gastric emptying study as a next step for her nausea JMP

## 2024-08-01 ENCOUNTER — Other Ambulatory Visit: Payer: Self-pay

## 2024-08-01 DIAGNOSIS — R110 Nausea: Secondary | ICD-10-CM

## 2024-08-03 ENCOUNTER — Telehealth: Payer: Self-pay | Admitting: Internal Medicine

## 2024-08-03 NOTE — Telephone Encounter (Signed)
 Patient requesting f/u call in regards to diarrhea. Please advise.

## 2024-08-03 NOTE — Telephone Encounter (Signed)
 Left voicemail for patient to return call.

## 2024-08-03 NOTE — Telephone Encounter (Signed)
 Patient would like to make Dr Albertus aware of diarrhea that started today. She notes that she has had 2 episodes of this. Not accompanied by fever or abdominal pain. She complains of continued nausea, 3 episodes today. She is scheduled for gastric emptying scan on 08/20/24. She is advised that she may continue ginger candy until her upcoming gastric emptying scan and is also advised that her diarrhea is most likely self-limiting. Advised that she should try a low fiber & BRAT diet over the next few days and advance as tolerated.

## 2024-08-03 NOTE — Telephone Encounter (Signed)
 Patient returning call  Requesting a call back  Please advise

## 2024-08-07 DIAGNOSIS — E039 Hypothyroidism, unspecified: Secondary | ICD-10-CM | POA: Diagnosis not present

## 2024-08-08 DIAGNOSIS — L308 Other specified dermatitis: Secondary | ICD-10-CM | POA: Diagnosis not present

## 2024-08-20 ENCOUNTER — Encounter (HOSPITAL_COMMUNITY)
Admission: RE | Admit: 2024-08-20 | Discharge: 2024-08-20 | Disposition: A | Discharge status: Home / Self Care | Source: Ambulatory Visit | Attending: Internal Medicine | Admitting: Internal Medicine

## 2024-08-20 DIAGNOSIS — R110 Nausea: Secondary | ICD-10-CM | POA: Insufficient documentation

## 2024-08-20 MED ORDER — TECHNETIUM TC 99M SULFUR COLLOID
2.0000 | Freq: Once | INTRAVENOUS | Status: AC
  Administered 2024-08-20: 2.18 via INTRAVENOUS

## 2024-08-28 ENCOUNTER — Encounter: Payer: Self-pay | Admitting: Internal Medicine

## 2024-08-29 ENCOUNTER — Other Ambulatory Visit (HOSPITAL_COMMUNITY): Payer: Self-pay | Admitting: Internal Medicine

## 2024-08-29 DIAGNOSIS — R109 Unspecified abdominal pain: Secondary | ICD-10-CM

## 2024-08-29 DIAGNOSIS — R110 Nausea: Secondary | ICD-10-CM

## 2024-08-31 ENCOUNTER — Ambulatory Visit: Payer: Self-pay | Admitting: Internal Medicine

## 2024-09-03 ENCOUNTER — Other Ambulatory Visit

## 2024-09-03 DIAGNOSIS — R110 Nausea: Secondary | ICD-10-CM | POA: Diagnosis not present

## 2024-09-03 DIAGNOSIS — R109 Unspecified abdominal pain: Secondary | ICD-10-CM

## 2024-09-03 MED ORDER — IOHEXOL 300 MG/ML  SOLN
100.0000 mL | Freq: Once | INTRAMUSCULAR | Status: AC | PRN
  Administered 2024-09-03: 100 mL via INTRAVENOUS
